# Patient Record
Sex: Female | Born: 1975 | Race: Black or African American | Hispanic: No | Marital: Single | Smoking: Current every day smoker
Health system: Southern US, Community
[De-identification: ages and names within clinical notes are randomized; demographics above are authoritative.]

## PROBLEM LIST (undated history)

## (undated) DIAGNOSIS — R51 Headache: Secondary | ICD-10-CM

## (undated) DIAGNOSIS — I10 Essential (primary) hypertension: Secondary | ICD-10-CM

---

## 2011-02-25 ENCOUNTER — Emergency Department (HOSPITAL_COMMUNITY)
Admission: EM | Admit: 2011-02-25 | Discharge: 2011-02-25 | Discharge status: Against Medical Advice | Payer: Managed Care, Other (non HMO) | Attending: Emergency Medicine | Admitting: Emergency Medicine

## 2011-02-25 DIAGNOSIS — N644 Mastodynia: Secondary | ICD-10-CM | POA: Insufficient documentation

## 2011-03-01 ENCOUNTER — Other Ambulatory Visit: Payer: Self-pay | Admitting: Family Medicine

## 2011-03-01 DIAGNOSIS — N6310 Unspecified lump in the right breast, unspecified quadrant: Secondary | ICD-10-CM

## 2011-03-14 ENCOUNTER — Ambulatory Visit
Admission: RE | Admit: 2011-03-14 | Discharge: 2011-03-14 | Disposition: A | Discharge status: Home / Self Care | Payer: Managed Care, Other (non HMO) | Source: Ambulatory Visit | Attending: Family Medicine | Admitting: Family Medicine

## 2011-03-14 DIAGNOSIS — N6310 Unspecified lump in the right breast, unspecified quadrant: Secondary | ICD-10-CM

## 2011-05-26 ENCOUNTER — Emergency Department (HOSPITAL_COMMUNITY)
Admission: EM | Admit: 2011-05-26 | Discharge: 2011-05-26 | Disposition: A | Discharge status: Home / Self Care | Payer: Managed Care, Other (non HMO) | Attending: Emergency Medicine | Admitting: Emergency Medicine

## 2011-05-26 ENCOUNTER — Encounter (HOSPITAL_COMMUNITY): Payer: Self-pay | Admitting: *Deleted

## 2011-05-26 DIAGNOSIS — J029 Acute pharyngitis, unspecified: Secondary | ICD-10-CM

## 2011-05-26 DIAGNOSIS — H6590 Unspecified nonsuppurative otitis media, unspecified ear: Secondary | ICD-10-CM | POA: Insufficient documentation

## 2011-05-26 DIAGNOSIS — R51 Headache: Secondary | ICD-10-CM | POA: Insufficient documentation

## 2011-05-26 DIAGNOSIS — H9209 Otalgia, unspecified ear: Secondary | ICD-10-CM | POA: Insufficient documentation

## 2011-05-26 DIAGNOSIS — R6883 Chills (without fever): Secondary | ICD-10-CM | POA: Insufficient documentation

## 2011-05-26 DIAGNOSIS — I10 Essential (primary) hypertension: Secondary | ICD-10-CM | POA: Insufficient documentation

## 2011-05-26 HISTORY — DX: Headache: R51

## 2011-05-26 HISTORY — DX: Essential (primary) hypertension: I10

## 2011-05-26 MED ORDER — ACETAMINOPHEN 325 MG PO TABS
975.0000 mg | ORAL_TABLET | Freq: Once | ORAL | Status: AC
  Administered 2011-05-26: 975 mg via ORAL

## 2011-05-26 MED ORDER — ACETAMINOPHEN 325 MG PO TABS
ORAL_TABLET | ORAL | Status: AC
  Filled 2011-05-26: qty 3

## 2011-05-26 MED ORDER — LIDOCAINE VISCOUS 2 % MT SOLN
20.0000 mL | Freq: Once | OROMUCOSAL | Status: AC
  Administered 2011-05-26: 20 mL via OROMUCOSAL
  Filled 2011-05-26: qty 15

## 2011-05-26 MED ORDER — OXYMETAZOLINE HCL 0.05 % NA SOLN
1.0000 | Freq: Once | NASAL | Status: AC
  Administered 2011-05-26: 1 via NASAL
  Filled 2011-05-26: qty 15

## 2011-05-26 NOTE — ED Notes (Signed)
Dr. Lockwood into room 

## 2011-05-26 NOTE — ED Notes (Signed)
C/o sore throat, R earache & HA. "Unsure of fever", states, "i feel hot". Onset 1-2d ago. (denies: nvd). Mentions has had HAs recently, no dx'd h/o migraines, has a h/o htn. Pt alert, interactive, calm, skin W&D, resps e/u, NAD, speaking in clear complete sentences, speech unalterred.

## 2011-05-26 NOTE — ED Provider Notes (Signed)
History     CSN: [id number]  Arrival date & time 05/26/11  4098   First MD Initiated Contact with Patient 05/26/11 [phone]      Chief Complaint  Patient presents with  . Sore Throat  . Headache    (Consider location/radiation/quality/duration/timing/severity/associated sxs/prior treatment) HPI The patient notes that her symptoms began gradually 3 days ago, since onset there has been persistent discomfort in her throat, head and right ear. The throat pain is worse with swallowing, speaking. The headache is diffuse, throbbing. The earache is described as sore, without hearing loss.  The patient notes that she does not like to take medications, and there have been no other notable alleviating factors. No clear exacerbating factors either. She also complains of subjective fevers, chills, without objective fever. No nausea, no vomiting, no diarrhea, no dysuria, no disorientation. Past Medical History  Diagnosis Date  . Hypertension   . Headache     History reviewed. No pertinent past surgical history.  History reviewed. No pertinent family history.  History  Substance Use Topics  . Smoking status: Never Smoker   . Smokeless tobacco: Not on file  . Alcohol Use: No    OB History    Grav Para Term Preterm Abortions TAB SAB Ect Mult Living                  Review of Systems  Constitutional:       HPI  HENT:       HPI otherwise negative  Eyes: Negative.   Respiratory:       HPI, otherwise negative  Cardiovascular:       HPI, otherwise nmegative  Gastrointestinal: Negative for vomiting.  Genitourinary:       HPI, otherwise negative  Musculoskeletal:       HPI, otherwise negative  Skin: Negative.   Neurological: Negative for syncope.    Allergies  Review of patient's allergies indicates not on file.  Home Medications   Current Outpatient Rx  Name Route Sig Dispense Refill  . AMLODIPINE BESYLATE 5 MG PO TABS Oral Take 5 mg by mouth daily.    . IBUPROFEN 200 MG PO  CAPS Oral Take 2 capsules by mouth once as needed. For migraines    . METOPROLOL SUCCINATE ER 50 MG PO TB24 Oral Take 50 mg by mouth daily. Take with or immediately following a meal.      BP 159/108  Pulse 98  Temp(Src) 98.7 F (37.1 C) (Oral)  Resp 20  SpO2 100%  Physical Exam  Nursing note and vitals reviewed. Constitutional: She is oriented to person, place, and time. She appears well-developed and well-nourished. No distress.  HENT:  Head: Atraumatic. Macrocephalic.  Right Ear: Tympanic membrane is not erythematous. A middle ear effusion is present.  Mouth/Throat: Uvula is midline, oropharynx is clear and moist and mucous membranes are normal. No oropharyngeal exudate.  Eyes: Conjunctivae and EOM are normal.  Cardiovascular: Normal rate and regular rhythm.   Pulmonary/Chest: Effort normal and breath sounds normal. No stridor. No respiratory distress.  Abdominal: She exhibits no distension.  Musculoskeletal: She exhibits no edema.  Neurological: She is alert and oriented to person, place, and time. No cranial nerve deficit.  Skin: Skin is warm and dry.  Psychiatric: She has a normal mood and affect.    ED Course  Procedures (including critical care time)  Labs Reviewed - No data to display No results found.   No diagnosis found.    MDM  This generally  well 36 year old female now presents with 3 days of sore throat, headache, ear pain. On exam the patient is in no distress. The patient's vital signs are notable for mild hypertension. The patient's right tympanic membrane has mild effusion, without overt signs of infection. Given the patient's description of complaints, her presentation is most consistent with pharyngitis, plus/minus sinusitis.  The patient is stable for outpatient continued evaluation, following the ED provision of analgesia and Sudafed.        Gerhard Munch, MD 05/26/11 [phone]

## 2011-05-27 ENCOUNTER — Emergency Department (HOSPITAL_COMMUNITY)
Admission: EM | Admit: 2011-05-27 | Discharge: 2011-05-27 | Disposition: A | Discharge status: Home / Self Care | Payer: Managed Care, Other (non HMO) | Attending: Emergency Medicine | Admitting: Emergency Medicine

## 2011-05-27 DIAGNOSIS — J029 Acute pharyngitis, unspecified: Secondary | ICD-10-CM | POA: Insufficient documentation

## 2011-05-27 DIAGNOSIS — R51 Headache: Secondary | ICD-10-CM | POA: Insufficient documentation

## 2011-05-27 DIAGNOSIS — J351 Hypertrophy of tonsils: Secondary | ICD-10-CM | POA: Insufficient documentation

## 2011-05-27 DIAGNOSIS — B9789 Other viral agents as the cause of diseases classified elsewhere: Secondary | ICD-10-CM | POA: Insufficient documentation

## 2011-05-27 DIAGNOSIS — H9209 Otalgia, unspecified ear: Secondary | ICD-10-CM | POA: Insufficient documentation

## 2011-05-27 DIAGNOSIS — Z79899 Other long term (current) drug therapy: Secondary | ICD-10-CM | POA: Insufficient documentation

## 2011-05-27 DIAGNOSIS — I10 Essential (primary) hypertension: Secondary | ICD-10-CM | POA: Insufficient documentation

## 2011-05-27 DIAGNOSIS — IMO0001 Reserved for inherently not codable concepts without codable children: Secondary | ICD-10-CM | POA: Insufficient documentation

## 2011-05-27 MED ORDER — ACETAMINOPHEN-CODEINE 120-12 MG/5ML PO SUSP: 5.0000 mL | Freq: Four times a day (QID) | ORAL | Status: AC | PRN

## 2011-05-27 NOTE — ED Notes (Signed)
MD at bedside. 

## 2011-05-27 NOTE — ED Provider Notes (Signed)
Medical screening examination/treatment/procedure(s) were performed by non-physician practitioner and as supervising physician I was immediately available for consultation/collaboration.   Forbes Cellar, MD 05/27/11 1141

## 2011-05-27 NOTE — ED Provider Notes (Signed)
History     CSN: [id number]  Arrival date & time 05/27/11  1038   First MD Initiated Contact with Patient 05/27/11 1116      Chief Complaint  Patient presents with  . Sore Throat  . Headache    (Consider location/radiation/quality/duration/timing/severity/associated sxs/prior treatment) HPI  36 year old female presenting to the ED complaining of sore throat headache. Patient has been having headache, sore throat, ear pain, and body aches for the past 4 days. She was seen in the ER yesterday for complaint. She was diagnosed with viral pharyngitis, questionable sinusitis. Patient was given medication nasal spray and gargle solution while in ED. Patient states she has been taking Advil at home with no improvement.  Pt denies fever, voice changes, cp, sob, or rash.  She denies vomiting, diiarrhea, or abd pain.    Past Medical History  Diagnosis Date  . Hypertension   . Headache     No past surgical history on file.  No family history on file.  History  Substance Use Topics  . Smoking status: Never Smoker   . Smokeless tobacco: Not on file  . Alcohol Use: No    OB History    Grav Para Term Preterm Abortions TAB SAB Ect Mult Living                  Review of Systems  All other systems reviewed and are negative.    Allergies  Review of patient's allergies indicates no known allergies.  Home Medications   Current Outpatient Rx  Name Route Sig Dispense Refill  . AMLODIPINE BESYLATE 5 MG PO TABS Oral Take 5 mg by mouth daily.    . IBUPROFEN 200 MG PO CAPS Oral Take 2 capsules by mouth once as needed. For migraines    . METOPROLOL SUCCINATE ER 50 MG PO TB24 Oral Take 50 mg by mouth daily. Take with or immediately following a meal.      BP 150/99  Pulse 99  Temp(Src) 98.6 F (37 C) (Oral)  Resp 16  SpO2 100%  Physical Exam  Nursing note and vitals reviewed. Constitutional: She appears well-developed. No distress.       Awake, alert, nontoxic appearance    HENT:  Head: Normocephalic and atraumatic.  Right Ear: External ear normal.  Left Ear: External ear normal.  Nose: Nose normal.  Mouth/Throat: Uvula is midline and oropharynx is clear and moist.         No voice changes, speak in complete sentence  Eyes: Right eye exhibits no discharge. Left eye exhibits no discharge.  Neck: Neck supple.  Pulmonary/Chest: Effort normal. She exhibits no tenderness.  Abdominal: There is no tenderness. There is no rebound.  Musculoskeletal: She exhibits no tenderness.       Baseline ROM, no obvious new focal weakness  Neurological:       Mental status and motor strength appears baseline for patient and situation  Skin: No rash noted.  Psychiatric: She has a normal mood and affect.    ED Course  Procedures (including critical care time)  Labs Reviewed - No data to display No results found.   No diagnosis found.    MDM  Patient is afebrile. She is able to speak in complete sentences. Of throat evaluation reveals tonsillar enlargement bilaterally without evidence of peri-tonsillar abscess. This is likely a viral etiology. Reassurance given. I will prescribe throat lorengzes and pain medication for the symptoms. Pt voice understanding.  Referral given.  Fayrene Helper, PA-C 05/27/11 1134

## 2011-05-27 NOTE — ED Notes (Signed)
Here yesterday for sore throat and h/a. Given nasal spray, and goggled gel, and then sent home. Been worse since yesterday. Taking advil.

## 2011-11-30 ENCOUNTER — Other Ambulatory Visit (HOSPITAL_COMMUNITY)
Admission: RE | Admit: 2011-11-30 | Discharge: 2011-11-30 | Disposition: A | Discharge status: Home / Self Care | Payer: Managed Care, Other (non HMO) | Source: Ambulatory Visit | Attending: Family Medicine | Admitting: Family Medicine

## 2011-11-30 DIAGNOSIS — Z113 Encounter for screening for infections with a predominantly sexual mode of transmission: Secondary | ICD-10-CM | POA: Insufficient documentation

## 2011-11-30 DIAGNOSIS — Z01419 Encounter for gynecological examination (general) (routine) without abnormal findings: Secondary | ICD-10-CM | POA: Insufficient documentation

## 2012-12-26 ENCOUNTER — Emergency Department (HOSPITAL_COMMUNITY)
Admission: EM | Admit: 2012-12-26 | Discharge: 2012-12-26 | Disposition: A | Discharge status: Home / Self Care | Payer: BC Managed Care – PPO | Attending: Emergency Medicine | Admitting: Emergency Medicine

## 2012-12-26 ENCOUNTER — Encounter (HOSPITAL_COMMUNITY): Payer: Self-pay | Admitting: Emergency Medicine

## 2012-12-26 DIAGNOSIS — R51 Headache: Secondary | ICD-10-CM | POA: Insufficient documentation

## 2012-12-26 DIAGNOSIS — Z79899 Other long term (current) drug therapy: Secondary | ICD-10-CM | POA: Insufficient documentation

## 2012-12-26 DIAGNOSIS — R112 Nausea with vomiting, unspecified: Secondary | ICD-10-CM | POA: Insufficient documentation

## 2012-12-26 DIAGNOSIS — F17200 Nicotine dependence, unspecified, uncomplicated: Secondary | ICD-10-CM | POA: Insufficient documentation

## 2012-12-26 DIAGNOSIS — R519 Headache, unspecified: Secondary | ICD-10-CM

## 2012-12-26 MED ORDER — SODIUM CHLORIDE 0.9 % IV BOLUS (SEPSIS)
1000.0000 mL | Freq: Once | INTRAVENOUS | Status: AC
  Administered 2012-12-26: 1000 mL via INTRAVENOUS

## 2012-12-26 MED ORDER — KETOROLAC TROMETHAMINE 30 MG/ML IJ SOLN
15.0000 mg | Freq: Once | INTRAMUSCULAR | Status: AC
  Administered 2012-12-26: 15 mg via INTRAVENOUS
  Filled 2012-12-26: qty 1

## 2012-12-26 MED ORDER — DIPHENHYDRAMINE HCL 50 MG/ML IJ SOLN
25.0000 mg | Freq: Once | INTRAMUSCULAR | Status: AC
  Administered 2012-12-26: 25 mg via INTRAVENOUS
  Filled 2012-12-26: qty 1

## 2012-12-26 MED ORDER — METOCLOPRAMIDE HCL 5 MG/ML IJ SOLN
10.0000 mg | Freq: Once | INTRAMUSCULAR | Status: AC
  Administered 2012-12-26: 10 mg via INTRAVENOUS
  Filled 2012-12-26: qty 2

## 2012-12-26 NOTE — ED Notes (Signed)
Woke up w/ h/a this am and it made her vomit and her bp is high

## 2013-01-05 NOTE — ED Provider Notes (Signed)
CSN: [id number]     Arrival date & time 12/26/12  1036 History     First MD Initiated Contact with Patient 12/26/12 2270     37 year old female with headache. Patient noticed when she woke up this morning. Constant throughout the day. Headache is in the frontal region and throbbing in nature. No appreciable exacerbating relieving factors. Associated with nausea and vomiting. No photophobia or other visual changes. No neck pain or neck stiffness. Denies any recent trauma. No use of blood thinning medication.No fever or chills. No acute numbness, tingling or loss strength. No intervention prior to arrival.  Chief Complaint  Patient presents with  . Hypertension   (Consider location/radiation/quality/duration/timing/severity/associated sxs/prior Treatment) HPI  Past Medical History  Diagnosis Date  . Hypertension   . Headache(784.0)    No past surgical history on file. No family history on file. History  Substance Use Topics  . Smoking status: Current Every Day Smoker  . Smokeless tobacco: Not on file  . Alcohol Use: No   OB History   Grav Para Term Preterm Abortions TAB SAB Ect Mult Living                 Review of Systems  All systems reviewed and negative, other than as noted in HPI.   Allergies  Review of patient's allergies indicates no known allergies.  Home Medications   Current Outpatient Rx  Name  Route  Sig  Dispense  Refill  . amLODipine (NORVASC) 5 MG tablet   Oral   Take 5 mg by mouth daily.         . Ibuprofen (ADVIL MIGRAINE) 200 MG CAPS   Oral   Take 2 capsules by mouth once as needed. For migraines         . metoprolol succinate (TOPROL-XL) 50 MG 24 hr tablet   Oral   Take 50 mg by mouth daily. Take with or immediately following a meal.          BP 133/85  Pulse 91  Temp(Src) 98.6 F (37 C)  Resp 16  SpO2 100% Physical Exam  Nursing note and vitals reviewed. Constitutional: She is oriented to person, place, and time. She appears  well-developed and well-nourished. No distress.  HENT:  Head: Normocephalic and atraumatic.  Eyes: Conjunctivae are normal. Pupils are equal, round, and reactive to light. Right eye exhibits no discharge. Left eye exhibits no discharge.  Neck: Neck supple.  No nuchal rigidity  Cardiovascular: Normal rate, regular rhythm and normal heart sounds.  Exam reveals no gallop and no friction rub.   No murmur heard. Pulmonary/Chest: Effort normal and breath sounds normal. No respiratory distress.  Abdominal: Soft. She exhibits no distension. There is no tenderness.  Musculoskeletal: She exhibits no edema and no tenderness.  Neurological: She is alert and oriented to person, place, and time. No cranial nerve deficit. She exhibits normal muscle tone. Coordination normal.  Skin: Skin is warm and dry.  Psychiatric: She has a normal mood and affect. Her behavior is normal. Thought content normal.    ED Course   Procedures (including critical care time)  Labs Reviewed - No data to display No results found. 1. Headache     MDM  37 year old female with headache. Suspect primary HA. Consider emergent secondary causes such as bleed, infectious or mass but doubt. There is no history of trauma. Pt has a nonfocal neurological exam. Afebrile and neck supple. No use of blood thinning medication. Consider ocular etiology such as acute  angle closure glaucoma but doubt. Pt denies acute change in visual acuity and eye exam unremarkable. Doubt temporal arteritis given age, no temporal tenderness and temporal artery pulsations palpable. Doubt CO poisoning. No contacts with similar symptoms. Doubt venous thrombosis. Doubt carotid or vertebral arteries dissection. Symptoms improved with meds. Patient's blood pressure improved with treatment of her headache. Feel that can be safely discharged, but strict return precautions discussed. Outpt fu.   Raeford Razor, MD 01/05/13 [phone]

## 2013-01-23 ENCOUNTER — Other Ambulatory Visit: Payer: Self-pay | Admitting: Nurse Practitioner

## 2013-01-23 ENCOUNTER — Other Ambulatory Visit (HOSPITAL_COMMUNITY)
Admission: RE | Admit: 2013-01-23 | Discharge: 2013-01-23 | Disposition: A | Discharge status: Home / Self Care | Payer: BC Managed Care – PPO | Source: Ambulatory Visit | Attending: Obstetrics and Gynecology | Admitting: Obstetrics and Gynecology

## 2013-01-23 DIAGNOSIS — Z1151 Encounter for screening for human papillomavirus (HPV): Secondary | ICD-10-CM | POA: Insufficient documentation

## 2013-01-23 DIAGNOSIS — Z01419 Encounter for gynecological examination (general) (routine) without abnormal findings: Secondary | ICD-10-CM | POA: Insufficient documentation

## 2013-05-10 ENCOUNTER — Encounter (HOSPITAL_COMMUNITY): Payer: Self-pay | Admitting: Emergency Medicine

## 2013-05-10 ENCOUNTER — Emergency Department (INDEPENDENT_AMBULATORY_CARE_PROVIDER_SITE_OTHER)
Admission: EM | Admit: 2013-05-10 | Discharge: 2013-05-10 | Disposition: A | Discharge status: Home / Self Care | Payer: Self-pay | Source: Home / Self Care | Attending: Family Medicine | Admitting: Family Medicine

## 2013-05-10 DIAGNOSIS — J069 Acute upper respiratory infection, unspecified: Secondary | ICD-10-CM

## 2013-05-10 DIAGNOSIS — I10 Essential (primary) hypertension: Secondary | ICD-10-CM

## 2013-05-10 NOTE — ED Provider Notes (Signed)
Elizabeth Rogers is a 37 y.o. female who presents to Urgent Care today for one day of cough muscle aches fatigue and chills. One episode of vomiting as well. No shortness of breath or diarrhea. No significant abdominal pain. Patient has tried some TheraFlu which made her feel sick to her stomach. Her son was recently ill with a similar illness. She feels well otherwise   Past Medical History  Diagnosis Date  . Hypertension   . RUEAVWUJ(811.9)    History  Substance Use Topics  . Smoking status: Current Every Day Smoker  . Smokeless tobacco: Not on file  . Alcohol Use: No   ROS as above Medications reviewed. No current facility-administered medications for this encounter.   Current Outpatient Prescriptions  Medication Sig Dispense Refill  . amLODipine (NORVASC) 5 MG tablet Take 5 mg by mouth daily.      . Ibuprofen (ADVIL MIGRAINE) 200 MG CAPS Take 2 capsules by mouth once as needed. For migraines      . metoprolol succinate (TOPROL-XL) 50 MG 24 hr tablet Take 50 mg by mouth daily. Take with or immediately following a meal.        Exam:  BP 157/122  Pulse 94  Temp(Src) 98.3 F (36.8 C) (Oral)  Resp 18  SpO2 99% Gen: Well NAD HEENT: EOMI,  MMM, difficult to see posterior pharynx but not very erythematous. Tympanic membranes are normal appearing bilaterally Lungs: Normal work of breathing. CTABL Heart: RRR no MRG Abd: NABS, Soft. NT, ND Exts: Non edematous BL  LE, warm and well perfused.   No results found for this or any previous visit (from the past 24 hour(s)). No results found.  Assessment and Plan: 37 y.o. female with  1) viral URI. Plan for symptomatic management with Tylenol. Additionally recommend Mucinex as needed. Followup if not improving. 2) hypertension: Elevated today. Not sure if this is a medication effect or the patient's normal blood pressure. I recommended she followup with her primary care provider in a few weeks for blood pressure management.  Discussed  warning signs or symptoms. Please see discharge instructions. Patient expresses understanding.      Rodolph Bong, MD 05/10/13 [phone]
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
## 2014-06-07 ENCOUNTER — Encounter (HOSPITAL_COMMUNITY): Payer: Self-pay | Admitting: Emergency Medicine

## 2014-06-07 ENCOUNTER — Emergency Department (HOSPITAL_COMMUNITY)
Admission: EM | Admit: 2014-06-07 | Discharge: 2014-06-07 | Disposition: A | Discharge status: Home / Self Care | Payer: No Typology Code available for payment source | Attending: Emergency Medicine | Admitting: Emergency Medicine

## 2014-06-07 DIAGNOSIS — R55 Syncope and collapse: Secondary | ICD-10-CM | POA: Insufficient documentation

## 2014-06-07 DIAGNOSIS — I10 Essential (primary) hypertension: Secondary | ICD-10-CM | POA: Insufficient documentation

## 2014-06-07 DIAGNOSIS — Z79899 Other long term (current) drug therapy: Secondary | ICD-10-CM | POA: Insufficient documentation

## 2014-06-07 DIAGNOSIS — Z720 Tobacco use: Secondary | ICD-10-CM | POA: Insufficient documentation

## 2014-06-07 LAB — URINALYSIS, ROUTINE W REFLEX MICROSCOPIC
BILIRUBIN URINE: NEGATIVE
GLUCOSE, UA: NEGATIVE mg/dL
Ketones, ur: NEGATIVE mg/dL
NITRITE: NEGATIVE
PH: 8 (ref 5.0–8.0)
PROTEIN: 100 mg/dL — AB
SPECIFIC GRAVITY, URINE: 1.015 (ref 1.005–1.030)
Urobilinogen, UA: 1 mg/dL (ref 0.0–1.0)

## 2014-06-07 LAB — URINE MICROSCOPIC-ADD ON

## 2014-06-07 LAB — I-STAT CHEM 8, ED
BUN: 4 mg/dL — AB (ref 6–23)
CREATININE: 0.8 mg/dL (ref 0.50–1.10)
Calcium, Ion: 1.11 mmol/L — ABNORMAL LOW (ref 1.12–1.23)
Chloride: 104 mmol/L (ref 96–112)
Glucose, Bld: 111 mg/dL — ABNORMAL HIGH (ref 70–99)
HCT: 46 % (ref 36.0–46.0)
HEMOGLOBIN: 15.6 g/dL — AB (ref 12.0–15.0)
POTASSIUM: 3.3 mmol/L — AB (ref 3.5–5.1)
Sodium: 140 mmol/L (ref 135–145)
TCO2: 20 mmol/L (ref 0–100)

## 2014-06-07 LAB — CBC
HEMATOCRIT: 42.9 % (ref 36.0–46.0)
Hemoglobin: 13.8 g/dL (ref 12.0–15.0)
MCH: 26.6 pg (ref 26.0–34.0)
MCHC: 32.2 g/dL (ref 30.0–36.0)
MCV: 82.8 fL (ref 78.0–100.0)
Platelets: 212 10*3/uL (ref 150–400)
RBC: 5.18 MIL/uL — AB (ref 3.87–5.11)
RDW: 13.5 % (ref 11.5–15.5)
WBC: 5.4 10*3/uL (ref 4.0–10.5)

## 2014-06-07 LAB — PREGNANCY, URINE: Preg Test, Ur: NEGATIVE

## 2014-06-07 MED ORDER — DIPHENHYDRAMINE HCL 50 MG/ML IJ SOLN
25.0000 mg | Freq: Once | INTRAMUSCULAR | Status: AC
  Administered 2014-06-07: 25 mg via INTRAVENOUS
  Filled 2014-06-07: qty 1

## 2014-06-07 MED ORDER — METOCLOPRAMIDE HCL 5 MG/ML IJ SOLN
10.0000 mg | Freq: Once | INTRAMUSCULAR | Status: AC
  Administered 2014-06-07: 10 mg via INTRAVENOUS
  Filled 2014-06-07: qty 2

## 2014-06-07 NOTE — ED Provider Notes (Signed)
CSN: [id number]     Arrival date & time 06/07/14  1535 History   First MD Initiated Contact with Patient 06/07/14 1542     Chief Complaint  Patient presents with  . Hypertension  . Nausea  . Dizziness     (Consider location/radiation/quality/duration/timing/severity/associated sxs/prior Treatment) HPI  Pt presenting with c/o episode of lightheadedness, nausea, frontal headache.  She states symptoms began suddenly while she was at work.  She felt that she would nearly faint.  Her BP was checked and it was elevated, so EMS was called.  She was given zofran en route which temporarily made her feel better.  No chest pain, no shortness of breath.  She was diagnosed with HTN and was prescribed metoprolol xl and amlodipine- states she has not been taking the meds for 2 weeks.  She states she doesn't like taking pills and was not convinced that she needed to take them.  There are no other associated systemic symptoms, there are no other alleviating or modifying factors.   Past Medical History  Diagnosis Date  . Hypertension   . Headache(784.0)    History reviewed. No pertinent past surgical history. No family history on file. History  Substance Use Topics  . Smoking status: Current Every Day Smoker  . Smokeless tobacco: Not on file  . Alcohol Use: No   OB History    No data available     Review of Systems  ROS reviewed and all otherwise negative except for mentioned in HPI    Allergies  Review of patient's allergies indicates no known allergies.  Home Medications   Prior to Admission medications   Medication Sig Start Date End Date Taking? Authorizing Provider  amLODipine (NORVASC) 5 MG tablet Take 5 mg by mouth daily.   Yes Historical Provider, MD  metoprolol succinate (TOPROL-XL) 50 MG 24 hr tablet Take 50 mg by mouth daily. Take with or immediately following a meal.   Yes Historical Provider, MD  vitamin B-12 (CYANOCOBALAMIN) 1000 MCG tablet Take 1,000 mcg by mouth daily.    Yes Historical Provider, MD  Ibuprofen (ADVIL MIGRAINE) 200 MG CAPS Take 2 capsules by mouth once as needed. For migraines    Historical Provider, MD   BP 144/92 mmHg  Pulse 69  Temp(Src) 98.1 F (36.7 C) (Oral)  Resp 17  Ht  (1.803 m)  Wt 256 lb (116.121 kg)  BMI 35.72 kg/m2  SpO2 100%  Vitals reviewed Physical Exam  Physical Examination: General appearance - alert, well appearing, and in no distress Mental status - alert, oriented to person, place, and time Eyes - pupils equal and reactive, extraocular eye movements intact Mouth - mucous membranes moist, pharynx normal without lesions Chest - clear to auscultation, no wheezes, rales or rhonchi, symmetric air entry Heart - normal rate, regular rhythm, normal S1, S2, no murmurs, rubs, clicks or gallops Neurological - alert, oriented  3, no cranial nerve deficit, strength 5/5 in extremities x 4, sensation intact Extremities - peripheral pulses normal, no pedal edema, no clubbing or cyanosis Skin - normal coloration and turgor, no rashes  ED Course  Procedures (including critical care time) Labs Review Labs Reviewed  URINALYSIS, ROUTINE W REFLEX MICROSCOPIC - Abnormal; Notable for the following:    Color, Urine RED (*)    APPearance CLOUDY (*)    Hgb urine dipstick LARGE (*)    Protein, ur 100 (*)    Leukocytes, UA TRACE (*)    All other components within normal limits  CBC - Abnormal; Notable for the following:    RBC 5.18 (*)    All other components within normal limits  I-STAT CHEM 8, ED - Abnormal; Notable for the following:    Potassium 3.3 (*)    BUN 4 (*)    Glucose, Bld 111 (*)    Calcium, Ion 1.11 (*)    Hemoglobin 15.6 (*)    All other components within normal limits  PREGNANCY, URINE  URINE MICROSCOPIC-ADD ON    Imaging Review No results found.   EKG Interpretation   Date/Time:  Monday June 07 2014 16:06:30 EST Ventricular Rate:  87 PR Interval:  166 QRS Duration: 83 QT Interval:  412 QTC  Calculation: 496 R Axis:   37 Text Interpretation:  Sinus rhythm Borderline prolonged QT interval No old  tracing to compare Confirmed by [facility]  MD, Ashleymarie Granderson [phone]([id number] on 06/07/2014  4:11:50 PM      MDM   Final diagnoses:  Near syncope  Essential hypertension    Pt presenting with sympotms of near syncope in the setting of hypertension and not taking her bp meds.  Pt has normal exam in the ED.  Labs are reassuring, no anemia, no renal abnormalities.  She has blood and protein in UA but is currently on menses.  Pt is planning to start taking her BP meds again and will arrange for close followup with PMD.  Discharged with strict return precautions.  Pt agreeable with plan.  Nursing notes including past medical history and social history reviewed and considered in documentation Prior records reviewed and considered during this visit     Ethelda ChickMartha K Linker, MD 06/07/14 1759

## 2014-06-07 NOTE — ED Notes (Signed)
Patient is aware of pending UA

## 2014-06-07 NOTE — ED Notes (Addendum)
Pt is on menstrual cycle 

## 2014-06-07 NOTE — Discharge Instructions (Signed)
Return to the ED with any concerns including chest pain, difficulty breathing, severe headache, changes in vision or speech, weakness in arms or legs, fainting, decreased level of alertness/lethargy, or any other alarming symptoms

## 2014-06-07 NOTE — ED Notes (Signed)
Pt c/o HTN, nausea, and dizziness. Pt stopped taking BP meds 2 weeks ago because " I wanted to be my own doctor" 20 g in left hand 4 mg zofran in route,

## 2015-11-07 ENCOUNTER — Encounter (HOSPITAL_COMMUNITY): Payer: Self-pay

## 2015-11-07 ENCOUNTER — Ambulatory Visit (HOSPITAL_COMMUNITY)
Admission: EM | Admit: 2015-11-07 | Discharge: 2015-11-07 | Disposition: A | Discharge status: Home / Self Care | Payer: Managed Care, Other (non HMO) | Attending: Family Medicine | Admitting: Family Medicine

## 2015-11-07 DIAGNOSIS — M542 Cervicalgia: Secondary | ICD-10-CM

## 2015-11-07 MED ORDER — CYCLOBENZAPRINE HCL 10 MG PO TABS: 10.0000 mg | ORAL_TABLET | Freq: Two times a day (BID) | ORAL | Status: DC | PRN
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
## 2015-11-07 NOTE — Discharge Instructions (Signed)

## 2015-11-07 NOTE — ED Notes (Signed)
Patient discharged by Frank Patrick, PA 
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
## 2016-01-09 ENCOUNTER — Emergency Department (HOSPITAL_COMMUNITY): Payer: Managed Care, Other (non HMO)

## 2016-01-09 ENCOUNTER — Encounter (HOSPITAL_COMMUNITY): Payer: Self-pay

## 2016-01-09 ENCOUNTER — Emergency Department (HOSPITAL_COMMUNITY)
Admission: EM | Admit: 2016-01-09 | Discharge: 2016-01-09 | Discharge status: Against Medical Advice | Payer: Managed Care, Other (non HMO) | Attending: Emergency Medicine | Admitting: Emergency Medicine

## 2016-01-09 DIAGNOSIS — R791 Abnormal coagulation profile: Secondary | ICD-10-CM | POA: Insufficient documentation

## 2016-01-09 DIAGNOSIS — R51 Headache: Secondary | ICD-10-CM | POA: Insufficient documentation

## 2016-01-09 DIAGNOSIS — R519 Headache, unspecified: Secondary | ICD-10-CM

## 2016-01-09 DIAGNOSIS — R200 Anesthesia of skin: Secondary | ICD-10-CM

## 2016-01-09 DIAGNOSIS — I639 Cerebral infarction, unspecified: Secondary | ICD-10-CM

## 2016-01-09 DIAGNOSIS — F17210 Nicotine dependence, cigarettes, uncomplicated: Secondary | ICD-10-CM | POA: Insufficient documentation

## 2016-01-09 DIAGNOSIS — M6289 Other specified disorders of muscle: Secondary | ICD-10-CM | POA: Diagnosis not present

## 2016-01-09 DIAGNOSIS — I10 Essential (primary) hypertension: Secondary | ICD-10-CM | POA: Insufficient documentation

## 2016-01-09 LAB — COMPREHENSIVE METABOLIC PANEL
ALBUMIN: 4 g/dL (ref 3.5–5.0)
ALT: 14 U/L (ref 14–54)
AST: 20 U/L (ref 15–41)
Alkaline Phosphatase: 57 U/L (ref 38–126)
Anion gap: 11 (ref 5–15)
BUN: 12 mg/dL (ref 6–20)
CHLORIDE: 99 mmol/L — AB (ref 101–111)
CO2: 26 mmol/L (ref 22–32)
CREATININE: 1 mg/dL (ref 0.44–1.00)
Calcium: 9.7 mg/dL (ref 8.9–10.3)
GFR calc Af Amer: >60 mL/min (ref >60)
GFR calc non Af Amer: >60 mL/min (ref >60)
GLUCOSE: 96 mg/dL (ref 65–99)
POTASSIUM: 3.1 mmol/L — AB (ref 3.5–5.1)
SODIUM: 136 mmol/L (ref 135–145)
Total Bilirubin: 0.6 mg/dL (ref 0.3–1.2)
Total Protein: 7.5 g/dL (ref 6.5–8.1)

## 2016-01-09 LAB — DIFFERENTIAL
BASOS ABS: 0 10*3/uL (ref 0.0–0.1)
BASOS PCT: 0 %
EOS ABS: 0.1 10*3/uL (ref 0.0–0.7)
Eosinophils Relative: 2 %
Lymphocytes Relative: 46 %
Lymphs Abs: 3.6 10*3/uL (ref 0.7–4.0)
MONOS PCT: 6 %
Monocytes Absolute: 0.4 10*3/uL (ref 0.1–1.0)
NEUTROS ABS: 3.5 10*3/uL (ref 1.7–7.7)
Neutrophils Relative %: 46 %

## 2016-01-09 LAB — I-STAT CHEM 8, ED
BUN: 14 mg/dL (ref 6–20)
CREATININE: 1 mg/dL (ref 0.44–1.00)
Calcium, Ion: 1.17 mmol/L (ref 1.13–1.30)
Chloride: 99 mmol/L — ABNORMAL LOW (ref 101–111)
GLUCOSE: 92 mg/dL (ref 65–99)
HEMATOCRIT: 45 % (ref 36.0–46.0)
HEMOGLOBIN: 15.3 g/dL — AB (ref 12.0–15.0)
POTASSIUM: 3.3 mmol/L — AB (ref 3.5–5.1)
Sodium: 139 mmol/L (ref 135–145)
TCO2: 27 mmol/L (ref 0–100)

## 2016-01-09 LAB — CBC
HEMATOCRIT: 42 % (ref 36.0–46.0)
Hemoglobin: 13.8 g/dL (ref 12.0–15.0)
MCH: 27.4 pg (ref 26.0–34.0)
MCHC: 32.9 g/dL (ref 30.0–36.0)
MCV: 83.3 fL (ref 78.0–100.0)
PLATELETS: 233 10*3/uL (ref 150–400)
RBC: 5.04 MIL/uL (ref 3.87–5.11)
RDW: 13.8 % (ref 11.5–15.5)
WBC: 7.6 10*3/uL (ref 4.0–10.5)

## 2016-01-09 LAB — I-STAT TROPONIN, ED: TROPONIN I, POC: 0 ng/mL (ref 0.00–0.08)

## 2016-01-09 LAB — CBG MONITORING, ED: GLUCOSE-CAPILLARY: 99 mg/dL (ref 65–99)

## 2016-01-09 LAB — PROTIME-INR
INR: 1.04
Prothrombin Time: 13.7 seconds (ref 11.4–15.2)

## 2016-01-09 LAB — APTT: APTT: 31 s (ref 24–36)

## 2016-01-09 MED ORDER — DIPHENHYDRAMINE HCL 50 MG/ML IJ SOLN
25.0000 mg | Freq: Once | INTRAMUSCULAR | Status: AC
  Administered 2016-01-09: 25 mg via INTRAVENOUS
  Filled 2016-01-09: qty 1

## 2016-01-09 MED ORDER — SODIUM CHLORIDE 0.9 % IV BOLUS (SEPSIS)
1000.0000 mL | Freq: Once | INTRAVENOUS | Status: AC
  Administered 2016-01-09: 1000 mL via INTRAVENOUS

## 2016-01-09 MED ORDER — METHYLPREDNISOLONE SODIUM SUCC 125 MG IJ SOLR
125.0000 mg | Freq: Once | INTRAMUSCULAR | Status: AC
  Administered 2016-01-09: 125 mg via INTRAVENOUS
  Filled 2016-01-09: qty 2

## 2016-01-09 MED ORDER — LORAZEPAM 2 MG/ML IJ SOLN
1.0000 mg | Freq: Once | INTRAMUSCULAR | Status: DC
  Filled 2016-01-09: qty 1

## 2016-01-09 MED ORDER — METOCLOPRAMIDE HCL 5 MG/ML IJ SOLN
10.0000 mg | Freq: Once | INTRAMUSCULAR | Status: AC
Start: 2016-01-09 — End: 2016-01-09
  Administered 2016-01-09: 10 mg via INTRAVENOUS
  Filled 2016-01-09: qty 2

## 2016-01-09 NOTE — ED Notes (Signed)
Patient elected to not have MRI done here.  Patient brought back to ED.  Ativan already drawn up to give.  This RN wasted ativan with Barrister's clerkCallie RN and disposed of in sharps.

## 2016-01-09 NOTE — Consult Note (Signed)
Neurology Consultation Reason for Consult: Right-sided weakness Referring Physician: Erin HearingMessner  CC: Right-sided weakness  History is obtained from: Patient  HPI: Elizabeth Rogers is a 40 y.o. female with a history of hypertension who presents with right-sided pain and weakness that started around 4:30 PM. She states that it started gradually beginning around her shoulder and then spreading down her right side. She also complains of headache on the right side. She describes weakness, but the formal confrontational testing is not clear that she is weak versus limited by pain.  She also endorses severe life stressors recently. She does have a history of migraines with occasional aura, but she has never had paresthesias with migraines before.   LKW: 4:30 PM tpa given?: no, mild symptoms    ROS: A 14 point ROS was performed and is negative except as noted in the HPI.   Past Medical History:  Diagnosis Date  . Headache(784.0)   . Hypertension      Family history: No history of similar   Social History:  reports that she has been smoking Cigarettes.  She has been smoking about 0.50 packs per day. She has never used smokeless tobacco. She reports that she drinks alcohol. She reports that she does not use drugs.   Exam: Current vital signs: BP (!) 165/110   Pulse 77   Temp 98.5 F (36.9 C) (Oral)   Resp 15   SpO2 100%  Vital signs in last 24 hours: Temp:  [98.5 F (36.9 C)] 98.5 F (36.9 C) (08/28 1922) Pulse Rate:  [77-88] 77 (08/28 2000) Resp:  [11-17] 15 (08/28 2000) BP: (165-169)/(110-128) 165/110 (08/28 2000) SpO2:  [99 %-100 %] 100 % (08/28 2000)   Physical Exam  Constitutional: Appears well-developed and well-nourished.  Psych: Affect appropriate to situation Eyes: No scleral injection HENT: No OP obstrucion Head: Normocephalic.  Cardiovascular: Normal rate and regular rhythm.  Respiratory: Effort normal and breath sounds normal to anterior ascultation GI: Soft.   No distension. There is no tenderness.  Skin: WDI  Neuro: Mental Status: Patient is awake, alert, oriented to person, place, month, year, and situation. Patient is able to give a clear and coherent history. No signs of aphasia or neglect Cranial Nerves: II: Visual Fields are full. Pupils are equal, round, and reactive to light.   III,IV, VI: EOMI without ptosis or diploplia.  V: Facial sensation is decreased throughout the right side, she does split the midline to pinprick on the for head VII: Facial movement is symmetric.  VIII: hearing is intact to voice X: Uvula elevates symmetrically XI: Shoulder shrug is symmetric. XII: tongue is midline without atrophy or fasciculations.  Motor: Tone is normal. Bulk is normal. Though she does not give full effort to confrontation, she is able to maintain both arm and leg aloft without drift. She has at least 4+/5 strength in both the arm and leg. Sensory: Sensation is decreased in the right arm and leg Plantars: Toes are downgoing bilaterally.  Cerebellar: FNF and HKS are intact bilaterally      I have reviewed labs in epic and the results pertinent to this consultation are: Mildly low potassium  I have reviewed the images obtained:No acute findings on the head CT.   Impression: 40 year old female with right-sided pain and weakness as well as neck pain and headache. My suspicion is that this is either complicated migraine or Somatization. These would be diagnoses of exclusion, however, and I would favor imaging to rule out cervical spine or brain pathology.  Recommendations: 1) MRI brain and C-spine 2) If negative, would consider migraine as etiology   Ritta Slot, MD [facility] [phone]  If 7pm- 7am, please page neurology on call as listed in [facility].

## 2016-01-09 NOTE — ED Notes (Signed)
Patient Alert and oriented X4. Stable and ambulatory. Patient verbalized understanding of the discharge instructions.  Patient belongings were taken by the patient. Patient understood risks of leaving without having MRI being done.

## 2016-01-09 NOTE — ED Notes (Signed)
Patient walked to bathroom.

## 2016-01-09 NOTE — ED Notes (Signed)
Patient taken to MRI

## 2016-01-09 NOTE — ED Triage Notes (Signed)
Pt complaining R side numbness and tingling since 16:30 today.

## 2016-01-12 NOTE — ED Provider Notes (Signed)
[facility]-EMERGENCY DEPT Provider Note   CSN: [id number] Arrival date & time: 01/09/16  1858     History   Chief Complaint Chief Complaint  Patient presents with  . Code Stroke    HPI Elizabeth Rogers is a 40 y.o. female.  Acute onset of right sided numbness and tingling approximately 2.5 hours PTA, no other weakness. No associated symptoms. No modifying factors. Has h/o headache similar to this but not with tingling.       Past Medical History:  Diagnosis Date  . Headache(784.0)   . Hypertension     There are no active problems to display for this patient.   History reviewed. No pertinent surgical history.  OB History    No data available       Home Medications    Prior to Admission medications   Medication Sig Start Date End Date Taking? Authorizing Provider  amLODipine (NORVASC) 5 MG tablet Take 5 mg by mouth daily.   Yes Historical Provider, MD  hydrochlorothiazide (HYDRODIURIL) 12.5 MG tablet Take 12.5 mg by mouth every morning.   Yes Historical Provider, MD  metoprolol tartrate (LOPRESSOR) 25 MG tablet Take 25 mg by mouth every morning.   Yes Historical Provider, MD  [facility] Johns Wort 150 MG CAPS Take 150 mg by mouth every morning.   Yes Historical Provider, MD    Family History History reviewed. No pertinent family history.  Social History Social History  Substance Use Topics  . Smoking status: Current Every Day Smoker    Packs/day: 0.50    Types: Cigarettes  . Smokeless tobacco: Never Used  . Alcohol use Yes     Comment: occ     Allergies   Review of patient's allergies indicates no known allergies.   Review of Systems Review of Systems  All other systems reviewed and are negative.    Physical Exam Updated Vital Signs BP (!) 162/102   Pulse 70   Temp 98.5 F (36.9 C)   Resp 16   SpO2 100%   Physical Exam  Constitutional: She is oriented to person, place, and time. She appears well-developed and well-nourished. No distress.  HENT:    Head: Normocephalic and atraumatic.  Eyes: Conjunctivae are normal.  Neck: Neck supple.  Cardiovascular: Normal rate and regular rhythm.   No murmur heard. Pulmonary/Chest: Effort normal and breath sounds normal. No respiratory distress.  Abdominal: Soft. There is no tenderness.  Musculoskeletal: She exhibits no edema.  Neurological: She is alert and oriented to person, place, and time. A cranial nerve deficit (decreased sensation right face) is present.  Skin: Skin is warm and dry.  Psychiatric: She has a normal mood and affect.  Nursing note and vitals reviewed.    ED Treatments / Results  Labs (all labs ordered are listed, but only abnormal results are displayed) Labs Reviewed  COMPREHENSIVE METABOLIC PANEL - Abnormal; Notable for the following:       Result Value   Potassium 3.1 (*)    Chloride 99 (*)    All other components within normal limits  I-STAT CHEM 8, ED - Abnormal; Notable for the following:    Potassium 3.3 (*)    Chloride 99 (*)    Hemoglobin 15.3 (*)    All other components within normal limits  PROTIME-INR  APTT  CBC  DIFFERENTIAL  Rosezena SensorI-STAT TROPOININ, ED  CBG MONITORING, ED    EKG  EKG Interpretation  Date/Time:  Monday January 09 2016 19:15:15 EDT Ventricular Rate:  91 PR Interval:  186 QRS Duration: 74 QT Interval:  372 QTC Calculation: 457 R Axis:   75 Text Interpretation:  Normal sinus rhythm Nonspecific T wave abnormality Abnormal ECG No significant change since last tracing Confirmed by BEATON  MD, ROBERT ([id number]) on 01/10/2016 9:55:57 AM       Radiology No results found.  Procedures Procedures (including critical care time)  CRITICAL CARE Performed by: Marily Memos Total critical care time: 35 minutes Critical care time was exclusive of separately billable procedures and treating other patients. Critical care was necessary to treat or prevent imminent or life-threatening deterioration. Critical care was time spent personally by me  on the following activities: development of treatment plan with patient and/or surrogate as well as nursing, discussions with consultants, evaluation of patient's response to treatment, examination of patient, obtaining history from patient or surrogate, ordering and performing treatments and interventions, ordering and review of laboratory studies, ordering and review of radiographic studies, pulse oximetry and re-evaluation of patient's condition.   Medications Ordered in ED Medications  metoCLOPramide (REGLAN) injection 10 mg (10 mg Intravenous Given 01/09/16 2020)  sodium chloride 0.9 % bolus 1,000 mL (0 mLs Intravenous Stopped 01/09/16 2234)  diphenhydrAMINE (BENADRYL) injection 25 mg (25 mg Intravenous Given 01/09/16 2020)  methylPREDNISolone sodium succinate (SOLU-MEDROL) 125 mg/2 mL injection 125 mg (125 mg Intravenous Given 01/09/16 2020)     Initial Impression / Assessment and Plan / ED Course  I have reviewed the triage vital signs and the nursing notes.  Pertinent labs & imaging results that were available during my care of the patient were reviewed by me and considered in my medical decision making (see chart for details).  Clinical Course    Code stroke 2/2 new onset facial numbness with headache. Considered tPA but not given secondary to low NIHSS. Symptoms improved in ED while waiting for MRI after headache cocktail, slightly persistent headache however.  Refused MRI. Discussed the risks/benefits of the test and patient understood and wishes to do it as an outpatient and will fu w/ pcp to get it scheduled. Neurologic exam appropriate. No focal deficits. Competent to make decisions and understood risks of doing so. Discharged AMA.   Final Clinical Impressions(s) / ED Diagnoses   Final diagnoses:  Numbness  Nonintractable headache, unspecified chronicity pattern, unspecified headache type    New Prescriptions Discharge Medication List as of 01/09/2016 10:24 PM       Marily Memos, MD 01/12/16 1559

## 2016-03-20 ENCOUNTER — Other Ambulatory Visit: Payer: Self-pay | Admitting: Nurse Practitioner

## 2016-03-20 ENCOUNTER — Other Ambulatory Visit (HOSPITAL_COMMUNITY)
Admission: RE | Admit: 2016-03-20 | Discharge: 2016-03-20 | Disposition: A | Discharge status: Home / Self Care | Payer: Managed Care, Other (non HMO) | Source: Ambulatory Visit | Attending: Nurse Practitioner | Admitting: Nurse Practitioner

## 2016-03-20 DIAGNOSIS — Z1151 Encounter for screening for human papillomavirus (HPV): Secondary | ICD-10-CM | POA: Diagnosis not present

## 2016-03-20 DIAGNOSIS — Z01419 Encounter for gynecological examination (general) (routine) without abnormal findings: Secondary | ICD-10-CM | POA: Diagnosis present

## 2016-03-21 LAB — CYTOLOGY - PAP
Diagnosis: NEGATIVE
HPV: NOT DETECTED

## 2016-06-25 ENCOUNTER — Encounter (HOSPITAL_COMMUNITY): Payer: Self-pay

## 2016-06-25 DIAGNOSIS — Z5321 Procedure and treatment not carried out due to patient leaving prior to being seen by health care provider: Secondary | ICD-10-CM | POA: Insufficient documentation

## 2016-06-25 DIAGNOSIS — F17210 Nicotine dependence, cigarettes, uncomplicated: Secondary | ICD-10-CM

## 2016-06-25 DIAGNOSIS — R51 Headache: Secondary | ICD-10-CM

## 2016-06-25 DIAGNOSIS — I10 Essential (primary) hypertension: Secondary | ICD-10-CM | POA: Diagnosis not present

## 2016-06-25 DIAGNOSIS — Z79899 Other long term (current) drug therapy: Secondary | ICD-10-CM | POA: Diagnosis not present

## 2016-06-25 NOTE — ED Triage Notes (Signed)
Pt states that has had a headache since Friday along with hypertension, denies n/v/photosensitivity. Pt states BP at home 190/140

## 2016-06-26 ENCOUNTER — Encounter (HOSPITAL_COMMUNITY): Payer: Self-pay | Admitting: Emergency Medicine

## 2016-06-26 ENCOUNTER — Emergency Department (HOSPITAL_COMMUNITY)
Admission: EM | Admit: 2016-06-26 | Discharge: 2016-06-26 | Disposition: A | Discharge status: Home / Self Care | Payer: Managed Care, Other (non HMO) | Attending: Emergency Medicine | Admitting: Emergency Medicine

## 2016-06-26 ENCOUNTER — Emergency Department (HOSPITAL_COMMUNITY)
Admission: EM | Admit: 2016-06-26 | Discharge: 2016-06-26 | Disposition: A | Discharge status: Home / Self Care | Payer: Managed Care, Other (non HMO) | Source: Home / Self Care

## 2016-06-26 ENCOUNTER — Emergency Department (HOSPITAL_COMMUNITY): Payer: Managed Care, Other (non HMO)

## 2016-06-26 DIAGNOSIS — R51 Headache: Secondary | ICD-10-CM | POA: Insufficient documentation

## 2016-06-26 DIAGNOSIS — F17210 Nicotine dependence, cigarettes, uncomplicated: Secondary | ICD-10-CM | POA: Insufficient documentation

## 2016-06-26 DIAGNOSIS — I10 Essential (primary) hypertension: Secondary | ICD-10-CM | POA: Insufficient documentation

## 2016-06-26 DIAGNOSIS — Z79899 Other long term (current) drug therapy: Secondary | ICD-10-CM | POA: Insufficient documentation

## 2016-06-26 DIAGNOSIS — R519 Headache, unspecified: Secondary | ICD-10-CM

## 2016-06-26 LAB — URINALYSIS, ROUTINE W REFLEX MICROSCOPIC
Bacteria, UA: NONE SEEN
Bilirubin Urine: NEGATIVE
Glucose, UA: NEGATIVE mg/dL
Ketones, ur: NEGATIVE mg/dL
Leukocytes, UA: NEGATIVE
NITRITE: NEGATIVE
PH: 6 (ref 5.0–8.0)
PROTEIN: 100 mg/dL — AB
SPECIFIC GRAVITY, URINE: 1.02 (ref 1.005–1.030)

## 2016-06-26 LAB — COMPREHENSIVE METABOLIC PANEL
ALBUMIN: 4.4 g/dL (ref 3.5–5.0)
ALK PHOS: 62 U/L (ref 38–126)
ALT: 21 U/L (ref 14–54)
ANION GAP: 13 (ref 5–15)
AST: 23 U/L (ref 15–41)
BILIRUBIN TOTAL: 0.5 mg/dL (ref 0.3–1.2)
BUN: 11 mg/dL (ref 6–20)
CALCIUM: 9.7 mg/dL (ref 8.9–10.3)
CO2: 24 mmol/L (ref 22–32)
CREATININE: 0.79 mg/dL (ref 0.44–1.00)
Chloride: 97 mmol/L — ABNORMAL LOW (ref 101–111)
GFR calc Af Amer: >60 mL/min (ref >60)
GFR calc non Af Amer: >60 mL/min (ref >60)
GLUCOSE: 131 mg/dL — AB (ref 65–99)
Potassium: 3.3 mmol/L — ABNORMAL LOW (ref 3.5–5.1)
Sodium: 134 mmol/L — ABNORMAL LOW (ref 135–145)
TOTAL PROTEIN: 8.1 g/dL (ref 6.5–8.1)

## 2016-06-26 LAB — CBC
HCT: 43.6 % (ref 36.0–46.0)
HEMOGLOBIN: 14.8 g/dL (ref 12.0–15.0)
MCH: 27.2 pg (ref 26.0–34.0)
MCHC: 33.9 g/dL (ref 30.0–36.0)
MCV: 80.1 fL (ref 78.0–100.0)
PLATELETS: 209 10*3/uL (ref 150–400)
RBC: 5.44 MIL/uL — ABNORMAL HIGH (ref 3.87–5.11)
RDW: 12.8 % (ref 11.5–15.5)
WBC: 4.5 10*3/uL (ref 4.0–10.5)

## 2016-06-26 LAB — LIPASE, BLOOD: Lipase: 18 U/L (ref 11–51)

## 2016-06-26 LAB — I-STAT BETA HCG BLOOD, ED (MC, WL, AP ONLY): I-stat hCG, quantitative: <5 m[IU]/mL (ref <5)

## 2016-06-26 MED ORDER — HYDROMORPHONE HCL 2 MG/ML IJ SOLN
1.0000 mg | Freq: Once | INTRAMUSCULAR | Status: AC
  Administered 2016-06-26: 1 mg via INTRAVENOUS
  Filled 2016-06-26: qty 1

## 2016-06-26 MED ORDER — PROMETHAZINE HCL 25 MG PO TABS: 25.0000 mg | ORAL_TABLET | Freq: Four times a day (QID) | ORAL | 0 refills | Status: DC | PRN

## 2016-06-26 MED ORDER — SODIUM CHLORIDE 0.9 % IV BOLUS (SEPSIS)
1000.0000 mL | Freq: Once | INTRAVENOUS | Status: AC
  Administered 2016-06-26: 1000 mL via INTRAVENOUS

## 2016-06-26 MED ORDER — METOCLOPRAMIDE HCL 5 MG/ML IJ SOLN
10.0000 mg | Freq: Once | INTRAMUSCULAR | Status: AC
Start: 2016-06-26 — End: 2016-06-26
  Administered 2016-06-26: 10 mg via INTRAVENOUS
  Filled 2016-06-26: qty 2

## 2016-06-26 MED ORDER — KETOROLAC TROMETHAMINE 30 MG/ML IJ SOLN
30.0000 mg | Freq: Once | INTRAMUSCULAR | Status: AC
  Administered 2016-06-26: 30 mg via INTRAVENOUS
  Filled 2016-06-26: qty 1

## 2016-06-26 MED ORDER — BUTALBITAL-APAP-CAFFEINE 50-325-40 MG PO TABS: 1.0000 | ORAL_TABLET | Freq: Four times a day (QID) | ORAL | 0 refills | Status: AC | PRN

## 2016-06-26 MED ORDER — DIPHENHYDRAMINE HCL 50 MG/ML IJ SOLN
25.0000 mg | Freq: Once | INTRAMUSCULAR | Status: AC
  Administered 2016-06-26: 25 mg via INTRAVENOUS
  Filled 2016-06-26: qty 1

## 2016-06-26 MED ORDER — ACETAMINOPHEN 500 MG PO TABS
1000.0000 mg | ORAL_TABLET | Freq: Once | ORAL | Status: AC
  Administered 2016-06-26: 1000 mg via ORAL
  Filled 2016-06-26: qty 2

## 2016-06-26 NOTE — ED Notes (Signed)
Patient did not answer when called to be roomed.  

## 2016-06-26 NOTE — ED Notes (Signed)
Patient did not answer for rooming

## 2016-06-26 NOTE — ED Triage Notes (Signed)
Pt c/o headache onset Friday. Pt reports dizziness and vomiting onset earlier last night. Pt was checked in last night but did not want to wait.

## 2016-06-26 NOTE — Discharge Instructions (Signed)
CT scan of your head was normal. Medication for pain and nausea. Increase fluids. Rest. Return if worse.

## 2016-06-26 NOTE — ED Notes (Signed)
Pt called for for vital sign reassessment, No answer 

## 2016-06-26 NOTE — ED Provider Notes (Signed)
[facility] Provider Note   CSN: [id number] Arrival date & time: 06/26/16  [id number]     History   Chief Complaint Chief Complaint  Patient presents with  . Headache  . Dizziness  . Emesis    HPI Elizabeth Rogers is a 41 y.o. female.  Patient has a bifrontal headache for the past several days.  No stiff neck or neurological deficits. No fever, chills, maxillary sinus pressure. Review systems positive for minimal dizziness and nausea. She has tried nothing for the pain at home. Severity is mild to moderate.      Past Medical History:  Diagnosis Date  . Headache(784.0)   . Hypertension     There are no active problems to display for this patient.   History reviewed. No pertinent surgical history.  OB History    No data available       Home Medications    Prior to Admission medications   Medication Sig Start Date End Date Taking? Authorizing Provider  amLODipine (NORVASC) 10 MG tablet Take 10 mg by mouth daily. 06/25/16  Yes Historical Provider, MD  metoprolol tartrate (LOPRESSOR) 25 MG tablet Take 25 mg by mouth every morning.   Yes Historical Provider, MD  [facility] Johns Wort 150 MG CAPS Take 150 mg by mouth every morning.   Yes Historical Provider, MD  Vitamin D, Ergocalciferol, (DRISDOL) 50000 units CAPS capsule Take 50,000 Units by mouth once a week. Sundays 03/21/16  Yes Historical Provider, MD  butalbital-acetaminophen-caffeine (FIORICET, ESGIC) 50-325-40 MG tablet Take 1-2 tablets by mouth every 6 (six) hours as needed for headache. 06/26/16 06/26/17  Donnetta HutchingBrian Traven Davids, MD  promethazine (PHENERGAN) 25 MG tablet Take 1 tablet (25 mg total) by mouth every 6 (six) hours as needed for nausea or vomiting. 06/26/16   Donnetta HutchingBrian Sanaai Doane, MD    Family History No family history on file.  Social History Social History  Substance Use Topics  . Smoking status: Current Every Day Smoker    Packs/day: 0.50    Types: Cigarettes  . Smokeless tobacco: Never Used  . Alcohol use Yes   Comment: occ     Allergies   Patient has no known allergies.   Review of Systems Review of Systems  All other systems reviewed and are negative.    Physical Exam Updated Vital Signs BP 131/89   Pulse 83   Temp 98.6 F (37 C) (Oral)   Ht 5\' 11"  (1.803 m)   Wt 245 lb (111.1 kg)   LMP 06/24/2016   SpO2 99%   BMI 34.17 kg/m   Physical Exam  Constitutional: She is oriented to person, place, and time. She appears well-developed and well-nourished.  Color, no apparent distress.  HENT:  Head: Normocephalic and atraumatic.  Eyes: Conjunctivae are normal.  Neck: Neck supple.  Cardiovascular: Normal rate and regular rhythm.   Pulmonary/Chest: Effort normal and breath sounds normal.  Abdominal: Soft. Bowel sounds are normal.  Musculoskeletal: Normal range of motion. Tenderness:    Neurological: She is alert and oriented to person, place, and time. Cranial nerve deficit:    Skin: Skin is warm and dry.  Psychiatric: She has a normal mood and affect. Her behavior is normal.  Nursing note and vitals reviewed.    ED Treatments / Results  Labs (all labs ordered are listed, but only abnormal results are displayed) Labs Reviewed  COMPREHENSIVE METABOLIC PANEL - Abnormal; Notable for the following:       Result Value   Sodium 134 (*)  Potassium 3.3 (*)    Chloride 97 (*)    Glucose, Bld 131 (*)    All other components within normal limits  CBC - Abnormal; Notable for the following:    RBC 5.44 (*)    All other components within normal limits  URINALYSIS, ROUTINE W REFLEX MICROSCOPIC - Abnormal; Notable for the following:    APPearance HAZY (*)    Hgb urine dipstick MODERATE (*)    Protein, ur 100 (*)    Squamous Epithelial / LPF 0-5 (*)    All other components within normal limits  LIPASE, BLOOD  I-STAT BETA HCG BLOOD, ED ([facility], [facility], [facility] ONLY)    EKG  EKG Interpretation  Date/Time:  Tuesday June 26 2016 06:53:52 EST Ventricular Rate:  76 PR Interval:  180 QRS  Duration: 88 QT Interval:  436 QTC Calculation: 490 R Axis:   110 Text Interpretation:   Suspect arm lead reversal, interpretation assumes no reversal Unusual P axis, possible ectopic atrial rhythm Right axis deviation Septal infarct , age undetermined Abnormal ECG Confirmed by Jaylin Roundy  MD, Cesario Weidinger ([id number]) on 06/26/2016 10:44:53 AM       Radiology Ct Head Wo Contrast  Result Date: 06/26/2016 CLINICAL DATA:  Persistent frontal headache EXAM: CT HEAD WITHOUT CONTRAST TECHNIQUE: Contiguous axial images were obtained from the base of the skull through the vertex without intravenous contrast. COMPARISON:  01/09/2016 FINDINGS: Brain: No evidence of acute infarction, hemorrhage, hydrocephalus, extra-axial collection or mass lesion/mass effect. Vascular: No hyperdense vessel or unexpected calcification. Skull: Normal. Negative for fracture or focal lesion. Sinuses/Orbits: No acute finding. Other: Basal ganglia calcifications are again noted on the left. IMPRESSION: No acute abnormality noted. Electronically Signed   By: Alcide Clever M.D.   On: 06/26/2016 11:27    Procedures Procedures (including critical care time)  Medications Ordered in ED Medications  acetaminophen (TYLENOL) tablet 1,000 mg (1,000 mg Oral Given 06/26/16 0917)  sodium chloride 0.9 % bolus 1,000 mL (0 mLs Intravenous Stopped 06/26/16 1207)  ketorolac (TORADOL) 30 MG/ML injection 30 mg (30 mg Intravenous Given 06/26/16 1025)  metoCLOPramide (REGLAN) injection 10 mg (10 mg Intravenous Given 06/26/16 1025)  diphenhydrAMINE (BENADRYL) injection 25 mg (25 mg Intravenous Given 06/26/16 1025)  HYDROmorphone (DILAUDID) injection 1 mg (1 mg Intravenous Given 06/26/16 1132)  sodium chloride 0.9 % bolus 1,000 mL (0 mLs Intravenous Stopped 06/26/16 1307)     Initial Impression / Assessment and Plan / ED Course  I have reviewed the triage vital signs and the nursing notes.  Pertinent labs & imaging results that were available during my care of the  patient were reviewed by me and considered in my medical decision making (see chart for details).     Patient has a normal neurological exam. CT head was negative. Patient feels better after IV fluids and pain medication. Discharge medications Fioricet and Phenergan 25 mg  Final Clinical Impressions(s) / ED Diagnoses   Final diagnoses:  Intractable headache, unspecified chronicity pattern, unspecified headache type    New Prescriptions New Prescriptions   BUTALBITAL-ACETAMINOPHEN-CAFFEINE (FIORICET, ESGIC) 50-325-40 MG TABLET    Take 1-2 tablets by mouth every 6 (six) hours as needed for headache.   PROMETHAZINE (PHENERGAN) 25 MG TABLET    Take 1 tablet (25 mg total) by mouth every 6 (six) hours as needed for nausea or vomiting.     Donnetta Hutching, MD 06/26/16 [id number]

## 2017-06-10 ENCOUNTER — Ambulatory Visit (HOSPITAL_COMMUNITY)
Admission: EM | Admit: 2017-06-10 | Discharge: 2017-06-10 | Disposition: A | Discharge status: Home / Self Care | Payer: Managed Care, Other (non HMO)

## 2017-06-16 ENCOUNTER — Encounter (HOSPITAL_COMMUNITY): Payer: Self-pay

## 2017-06-16 ENCOUNTER — Emergency Department (HOSPITAL_COMMUNITY): Payer: No Typology Code available for payment source

## 2017-06-16 ENCOUNTER — Emergency Department (HOSPITAL_COMMUNITY)
Admission: EM | Admit: 2017-06-16 | Discharge: 2017-06-16 | Disposition: A | Discharge status: Home / Self Care | Payer: No Typology Code available for payment source | Attending: Emergency Medicine | Admitting: Emergency Medicine

## 2017-06-16 DIAGNOSIS — M545 Low back pain: Secondary | ICD-10-CM | POA: Diagnosis present

## 2017-06-16 DIAGNOSIS — Z79899 Other long term (current) drug therapy: Secondary | ICD-10-CM | POA: Diagnosis not present

## 2017-06-16 DIAGNOSIS — F17210 Nicotine dependence, cigarettes, uncomplicated: Secondary | ICD-10-CM | POA: Diagnosis not present

## 2017-06-16 DIAGNOSIS — I10 Essential (primary) hypertension: Secondary | ICD-10-CM | POA: Diagnosis not present

## 2017-06-16 LAB — POC URINE PREG, ED: PREG TEST UR: NEGATIVE

## 2017-06-16 MED ORDER — CYCLOBENZAPRINE HCL 10 MG PO TABS: 10.0000 mg | ORAL_TABLET | Freq: Two times a day (BID) | ORAL | 0 refills | Status: DC | PRN

## 2017-06-16 NOTE — Discharge Instructions (Signed)
The x-ray of your spine is reassuring.  No fracture.  Please take ibuprofen 800 mg every 6 hours as needed for pain.  You can also take Tylenol.  I have written a prescription for muscle relaxer medicine called Flexeril.  This medicine can make you drowsy so please do not drive, work or drink alcohol while taking it.  You can also apply heat to the lower back to help you with your symptoms.  Please schedule an appointment with your OB/GYN for further evaluation of your breast lump.  Return to the emergency department if you develop any new or worsening symptoms.
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
## 2017-06-16 NOTE — ED Notes (Signed)
Declined W/C at D/C and was escorted to lobby by RN. 
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
## 2017-12-29 ENCOUNTER — Emergency Department (HOSPITAL_COMMUNITY)
Admission: EM | Admit: 2017-12-29 | Discharge: 2017-12-29 | Disposition: A | Discharge status: Home / Self Care | Payer: Managed Care, Other (non HMO) | Attending: Emergency Medicine | Admitting: Emergency Medicine

## 2017-12-29 ENCOUNTER — Encounter (HOSPITAL_COMMUNITY): Payer: Self-pay

## 2017-12-29 DIAGNOSIS — K5900 Constipation, unspecified: Secondary | ICD-10-CM | POA: Insufficient documentation

## 2017-12-29 DIAGNOSIS — Z5321 Procedure and treatment not carried out due to patient leaving prior to being seen by health care provider: Secondary | ICD-10-CM | POA: Insufficient documentation

## 2017-12-29 NOTE — ED Notes (Signed)
Called in waiting room for vitals check with no answer

## 2017-12-29 NOTE — ED Notes (Signed)
Called in waiting room with no answer 

## 2017-12-29 NOTE — ED Triage Notes (Signed)
Last BM 1 1/2 to 2 weeks ago.  Pt seen at PCP 12-23-17 was prescribed ? Med,  increased the dose next day, seen again was prescribed miralax, was told today to drink prune juice. Has not taken BP meds today.

## 2017-12-29 NOTE — ED Notes (Signed)
Pt called for vitals recheck, no answer.  

## 2017-12-31 NOTE — ED Notes (Signed)
Follow up call made no answer   12/31/17  1337  s Chukwuemeka Artola rn

## 2018-01-06 ENCOUNTER — Other Ambulatory Visit: Payer: Self-pay

## 2018-01-06 ENCOUNTER — Encounter (HOSPITAL_COMMUNITY): Payer: Self-pay

## 2018-01-06 ENCOUNTER — Emergency Department (HOSPITAL_COMMUNITY): Payer: Managed Care, Other (non HMO)

## 2018-01-06 ENCOUNTER — Emergency Department (HOSPITAL_COMMUNITY)
Admission: EM | Admit: 2018-01-06 | Discharge: 2018-01-06 | Disposition: A | Discharge status: Home / Self Care | Payer: Managed Care, Other (non HMO) | Attending: Emergency Medicine | Admitting: Emergency Medicine

## 2018-01-06 DIAGNOSIS — I10 Essential (primary) hypertension: Secondary | ICD-10-CM | POA: Insufficient documentation

## 2018-01-06 DIAGNOSIS — F17210 Nicotine dependence, cigarettes, uncomplicated: Secondary | ICD-10-CM | POA: Insufficient documentation

## 2018-01-06 DIAGNOSIS — A5900 Urogenital trichomoniasis, unspecified: Secondary | ICD-10-CM | POA: Insufficient documentation

## 2018-01-06 DIAGNOSIS — R1084 Generalized abdominal pain: Secondary | ICD-10-CM | POA: Diagnosis present

## 2018-01-06 DIAGNOSIS — N760 Acute vaginitis: Secondary | ICD-10-CM | POA: Insufficient documentation

## 2018-01-06 DIAGNOSIS — Z79899 Other long term (current) drug therapy: Secondary | ICD-10-CM | POA: Diagnosis not present

## 2018-01-06 DIAGNOSIS — B9689 Other specified bacterial agents as the cause of diseases classified elsewhere: Secondary | ICD-10-CM

## 2018-01-06 DIAGNOSIS — A599 Trichomoniasis, unspecified: Secondary | ICD-10-CM

## 2018-01-06 LAB — COMPREHENSIVE METABOLIC PANEL
ALT: 38 U/L (ref 0–44)
ANION GAP: 7 (ref 5–15)
AST: 28 U/L (ref 15–41)
Albumin: 3.5 g/dL (ref 3.5–5.0)
Alkaline Phosphatase: 64 U/L (ref 38–126)
BUN: 11 mg/dL (ref 6–20)
CALCIUM: 8.8 mg/dL — AB (ref 8.9–10.3)
CHLORIDE: 106 mmol/L (ref 98–111)
CO2: 25 mmol/L (ref 22–32)
Creatinine, Ser: 0.95 mg/dL (ref 0.44–1.00)
GFR calc Af Amer: >60 mL/min (ref >60)
Glucose, Bld: 88 mg/dL (ref 70–99)
Potassium: 3.5 mmol/L (ref 3.5–5.1)
SODIUM: 138 mmol/L (ref 135–145)
Total Bilirubin: 0.8 mg/dL (ref 0.3–1.2)
Total Protein: 6.2 g/dL — ABNORMAL LOW (ref 6.5–8.1)

## 2018-01-06 LAB — URINALYSIS, ROUTINE W REFLEX MICROSCOPIC
GLUCOSE, UA: NEGATIVE mg/dL
Ketones, ur: NEGATIVE mg/dL
Nitrite: NEGATIVE
PH: 6 (ref 5.0–8.0)
PROTEIN: 100 mg/dL — AB
Specific Gravity, Urine: >1.03 — ABNORMAL HIGH (ref 1.005–1.030)

## 2018-01-06 LAB — WET PREP, GENITAL
SPERM: NONE SEEN
Yeast Wet Prep HPF POC: NONE SEEN

## 2018-01-06 LAB — CBC
HEMATOCRIT: 45.7 % (ref 36.0–46.0)
Hemoglobin: 15.2 g/dL — ABNORMAL HIGH (ref 12.0–15.0)
MCH: 29 pg (ref 26.0–34.0)
MCHC: 33.3 g/dL (ref 30.0–36.0)
MCV: 87 fL (ref 78.0–100.0)
PLATELETS: 240 10*3/uL (ref 150–400)
RBC: 5.25 MIL/uL — AB (ref 3.87–5.11)
RDW: 14.2 % (ref 11.5–15.5)
WBC: 7.5 10*3/uL (ref 4.0–10.5)

## 2018-01-06 LAB — URINALYSIS, MICROSCOPIC (REFLEX)

## 2018-01-06 LAB — I-STAT BETA HCG BLOOD, ED (MC, WL, AP ONLY): I-stat hCG, quantitative: <5 m[IU]/mL (ref <5)

## 2018-01-06 LAB — LIPASE, BLOOD: LIPASE: 26 U/L (ref 11–51)

## 2018-01-06 MED ORDER — AZITHROMYCIN 250 MG PO TABS
1000.0000 mg | ORAL_TABLET | Freq: Once | ORAL | Status: AC
  Administered 2018-01-06: 1000 mg via ORAL
  Filled 2018-01-06: qty 4

## 2018-01-06 MED ORDER — METRONIDAZOLE 500 MG PO TABS: 500.0000 mg | ORAL_TABLET | Freq: Two times a day (BID) | ORAL | 0 refills | Status: AC

## 2018-01-06 MED ORDER — LIDOCAINE HCL (PF) 1 % IJ SOLN
INTRAMUSCULAR | Status: AC
  Filled 2018-01-06: qty 30

## 2018-01-06 MED ORDER — IOHEXOL 300 MG/ML  SOLN
100.0000 mL | Freq: Once | INTRAMUSCULAR | Status: AC | PRN
  Administered 2018-01-06: 100 mL via INTRAVENOUS

## 2018-01-06 MED ORDER — CEFTRIAXONE SODIUM 250 MG IJ SOLR
250.0000 mg | Freq: Once | INTRAMUSCULAR | Status: AC
  Administered 2018-01-06: 250 mg via INTRAMUSCULAR
  Filled 2018-01-06: qty 250

## 2018-01-06 MED ORDER — IOPAMIDOL (ISOVUE-300) INJECTION 61%
INTRAVENOUS | Status: AC
  Filled 2018-01-06: qty 100

## 2018-01-06 NOTE — ED Provider Notes (Signed)
[facility]-EMERGENCY DEPT Provider Note   CSN: [id number] Arrival date & time: 01/06/18  1050     History   Chief Complaint Chief Complaint  Patient presents with  . Constipation  . Bloated  . Hypertension    HPI Elizabeth Rogers is a 42 y.o. female.  The history is provided by the patient.  Abdominal Pain   This is a new problem. The current episode started more than 1 week ago. The problem occurs constantly. The problem has been gradually worsening. The pain is associated with eating. The pain is located in the generalized abdominal region. The quality of the pain is aching and dull. The pain is at a severity of 7/10. The pain is moderate. Associated symptoms include nausea and constipation. Pertinent negatives include anorexia, fever, belching, diarrhea, flatus, melena, vomiting, dysuria, hematuria, headaches, arthralgias and myalgias. Nothing aggravates the symptoms. Nothing relieves the symptoms. Past workup does not include GI consult.    Past Medical History:  Diagnosis Date  . Headache(784.0)   . Hypertension     There are no active problems to display for this patient.   History reviewed. No pertinent surgical history.   OB History   None      Home Medications    Prior to Admission medications   Medication Sig Start Date End Date Taking? Authorizing Provider  hydrochlorothiazide (MICROZIDE) 12.5 MG capsule Take 12.5 mg by mouth 2 (two) times daily.   Yes [provider]  metoprolol succinate (TOPROL-XL) 25 MG 24 hr tablet Take 25 mg by mouth daily.   Yes [provider]  metroNIDAZOLE (FLAGYL) 500 MG tablet Take 1 tablet (500 mg total) by mouth 2 (two) times daily for 7 days. 01/06/18 01/13/18  Virgina Norfolk, DO    Family History History reviewed. No pertinent family history.  Social History Social History   Tobacco Use  . Smoking status: Current Every Day Smoker    Packs/day: 0.50    Types: Cigarettes  .  Smokeless tobacco: Never Used  Substance Use Topics  . Alcohol use: Yes    Comment: occ  . Drug use: No     Allergies   Patient has no known allergies.   Review of Systems Review of Systems  Constitutional: Negative for chills and fever.  HENT: Negative for ear pain and sore throat.   Eyes: Negative for pain and visual disturbance.  Respiratory: Negative for cough and shortness of breath.   Cardiovascular: Negative for chest pain and palpitations.  Gastrointestinal: Positive for abdominal distention, abdominal pain, constipation and nausea. Negative for anorexia, diarrhea, flatus, melena and vomiting.  Genitourinary: Negative for dysuria and hematuria.  Musculoskeletal: Negative for arthralgias, back pain and myalgias.  Skin: Negative for color change and rash.  Neurological: Negative for seizures, syncope and headaches.  All other systems reviewed and are negative.    Physical Exam Updated Vital Signs  ED Triage Vitals  Enc Vitals Group     BP 01/06/18 1058 (!) 171/133     Pulse Rate 01/06/18 1058 100     Resp 01/06/18 1058 20     Temp 01/06/18 1058 98.5 F (36.9 C)     Temp Source 01/06/18 1058 Oral     SpO2 01/06/18 1058 100 %     Weight 01/06/18 1058 236 lb (107 kg)     Height 01/06/18 1058 5\' 11"  (1.803 m)     Head Circumference --      Peak Flow --  Pain Score 01/06/18 1104 9     Pain Loc --      Pain Edu? --      Excl. in GC? --    Physical Exam  Constitutional: She is oriented to person, place, and time. She appears well-developed and well-nourished. No distress.  HENT:  Head: Normocephalic and atraumatic.  Eyes: Pupils are equal, round, and reactive to light. Conjunctivae are normal.  Neck: Normal range of motion. Neck supple.  Cardiovascular: Normal rate, regular rhythm, normal heart sounds and intact distal pulses.  No murmur heard. Pulmonary/Chest: Effort normal and breath sounds normal. No respiratory distress.  Abdominal: Soft. She exhibits  distension. There is tenderness.  Musculoskeletal: Normal range of motion. She exhibits no edema.  Neurological: She is alert and oriented to person, place, and time.  Skin: Skin is warm and dry. Capillary refill takes less than 2 seconds.  Psychiatric: She has a normal mood and affect.  Nursing note and vitals reviewed.    ED Treatments / Results  Labs (all labs ordered are listed, but only abnormal results are displayed) Labs Reviewed  WET PREP, GENITAL - Abnormal; Notable for the following components:      Result Value   Trich, Wet Prep PRESENT (*)    Clue Cells Wet Prep HPF POC PRESENT (*)    WBC, Wet Prep HPF POC FEW (*)    All other components within normal limits  CBC - Abnormal; Notable for the following components:   RBC 5.25 (*)    Hemoglobin 15.2 (*)    All other components within normal limits  URINALYSIS, ROUTINE W REFLEX MICROSCOPIC - Abnormal; Notable for the following components:   Specific Gravity, Urine >1.030 (*)    Hgb urine dipstick SMALL (*)    Bilirubin Urine SMALL (*)    Protein, ur 100 (*)    Leukocytes, UA SMALL (*)    All other components within normal limits  COMPREHENSIVE METABOLIC PANEL - Abnormal; Notable for the following components:   Calcium 8.8 (*)    Total Protein 6.2 (*)    All other components within normal limits  URINALYSIS, MICROSCOPIC (REFLEX) - Abnormal; Notable for the following components:   Bacteria, UA MANY (*)    Trichomonas, UA PRESENT (*)    All other components within normal limits  LIPASE, BLOOD  HIV ANTIBODY (ROUTINE TESTING)  HIV 1 RNA QUANT-NO REFLEX-BLD  I-STAT BETA HCG BLOOD, ED ([facility], [facility], [facility] ONLY)  GC/CHLAMYDIA PROBE AMP ([place]) NOT AT [facility]    EKG EKG Interpretation  Date/Time:  Monday January 06 2018 11:32:36 EDT Ventricular Rate:  95 PR Interval:    QRS Duration: 84 QT Interval:  388 QTC Calculation: 488 R Axis:   88 Text Interpretation:  Sinus rhythm Left atrial enlargement Probable left ventricular  hypertrophy Borderline T abnormalities, lateral leads Confirmed by Virgina Norfolk [phone]) on 01/06/2018 12:16:46 PM   Radiology Ct Abdomen Pelvis W Contrast  Result Date: 01/06/2018 CLINICAL DATA:  Small bowel movement today. Radiographs indicate that she is constipated. Patient portions given prep for colonoscopy but had a small amount of liquid stool last week. EXAM: CT ABDOMEN AND PELVIS WITH CONTRAST TECHNIQUE: Multidetector CT imaging of the abdomen and pelvis was performed using the standard protocol following bolus administration of intravenous contrast. CONTRAST:  OMNIPAQUE IOHEXOL 300 MG/ML  SOLN COMPARISON:  None. FINDINGS: Lower chest: Top-normal size heart without pericardial effusion. Trace to small bilateral pleural effusions, right greater than left. Linear subsegmental atelectasis and/or scarring  in the left lower lobe. Hepatobiliary: No focal liver abnormality is seen. No gallstones, gallbladder wall thickening, or biliary dilatation. Pancreas: Unremarkable. No pancreatic ductal dilatation or surrounding inflammatory changes. Spleen: Normal in size without focal abnormality. Adrenals/Urinary Tract: Adrenal glands are unremarkable. Kidneys are normal, without renal calculi, focal lesion, or hydronephrosis. Bladder is unremarkable. Small lower pelvic calcifications compatible phleboliths are noted bilaterally. Stomach/Bowel: The stomach is decompressed. The duodenal sweep and ligament of Treitz are normal. No small bowel obstruction or inflammation. Average amount of fecal retention is noted within the colon. Appendix is normal in caliber and air-filled. No large bowel inflammatory change or obstruction is seen. Vascular/Lymphatic: No significant vascular findings are present. No enlarged abdominal or pelvic lymph nodes. Reproductive: IUD noted in the endometrial cavity. Simple appearing right adnexal cyst measuring 4 x 2.7 x 3.6 cm likely ovarian in etiology without worrisome features.  Trace associated free fluid in the pelvis, likely physiologic. The uterus demonstrates no mass. Left adnexa is unremarkable. Other: No free air.  Trace periumbilical fat containing hernia. Musculoskeletal: Small subcutaneous lipoma in the left lower quadrant measuring 1.5 cm in diameter. No aggressive nor acute osseous abnormality. IMPRESSION: 1. No bowel obstruction or inflammation. Stool retention does not appear significantly increased within the colon. 2. Trace to small bilateral pleural effusions, right greater than left without acute pulmonary consolidations. Minimal scarring or atelectasis is noted in the left lower lobe. 3. Simple appearing right ovarian cyst measuring 4 x 2.7 x 3.6 cm. Trace physiologic fluid in the cul-de-sac. IUD in place. 4. Tiny periumbilical fat containing hernia. Small left lower quadrant subcutaneous measuring 1.5 cm in diameter. Electronically Signed   By: Tollie Eth M.D.   On: 01/06/2018 14:01    Procedures Procedures (including critical care time)  Medications Ordered in ED Medications  iopamidol (ISOVUE-300) 61 % injection (has no administration in time range)  lidocaine (PF) (XYLOCAINE) 1 % injection (has no administration in time range)  iohexol (OMNIPAQUE) 300 MG/ML solution 100 mL (100 mLs Intravenous Contrast Given 01/06/18 1339)  cefTRIAXone (ROCEPHIN) injection 250 mg (250 mg Intramuscular Given 01/06/18 1443)  azithromycin (ZITHROMAX) tablet 1,000 mg (1,000 mg Oral Given 01/06/18 1443)     Initial Impression / Assessment and Plan / ED Course  I have reviewed the triage vital signs and the nursing notes.  Pertinent labs & imaging results that were available during my care of the patient were reviewed by me and considered in my medical decision making (see chart for details).     Tanequa Kretz is a 42 year old female with history of hypertension, headache who presents to the ED with abdominal pain, bloating.  Patient with unremarkable vitals.  No fever.   Patient with symptoms over the last 11 days and believes that it is associated with constipation.  Patient is not passing gas.  Denies any nausea, vomiting.  Has diffuse abdominal tenderness.  Has tried multiple over-the-counter constipation medications without much relief.  Patient denies any fever, chills.  Patient has diffuse tenderness on abdominal exam.  Otherwise is well-appearing.  Lab work shows normal lipase and no concern for pancreatitis.  No significant anemia, electrolyte abnormality, kidney injury.  Urinalysis shows no signs of infection.  CT abdomen and pelvis shows no acute findings.  No obstruction.    Urinalysis was positive for trichomonas and thus had further discussion with patient and she does admit to some vaginal discharge.  Pelvic exam shows scant vaginal discharge.  No cervical motion tenderness and no concern for  PID.  Patient with positive bacterial vaginosis as well.  Patient was treated empirically for gonorrhea and chlamydia with Rocephin and azithromycin.  Given Flagyl for trichomonas.  HIV testing ordered per patient request and will follow-up with primary care doctor.  Patient had negative pregnancy test.  Recommend continued use of MiraLAX as needed and patient was discharged from ED in good condition.  Told to return to ED if symptoms worsen recommend and follow-up with primary care doctor.  Final Clinical Impressions(s) / ED Diagnoses   Final diagnoses:  Trichomoniasis  Bacterial vaginosis    ED Discharge Orders         Ordered    metroNIDAZOLE (FLAGYL) 500 MG tablet  2 times daily     01/06/18 1503           Virgina NorfolkCuratolo, Constanza Mincy, DO 01/06/18 1550

## 2018-01-06 NOTE — ED Notes (Signed)
Pt updated on plan of care and reason for delay on CT. Pt requesting water. Pt informed after CT this RN would ask MD about PO fluids.

## 2018-01-06 NOTE — ED Triage Notes (Addendum)
Patient states she had a small BM today. Patient reports that she had an x-ray that showed she was constipated. patient reports that she was given the prep for a colonoscopy,but had a small amount of liquid stool last week.  Patient hypertensive in triage-165/133. Patient states she did not take her BP meds today.

## 2018-01-06 NOTE — ED Notes (Signed)
Pt ambulating to bathroom.

## 2018-01-06 NOTE — ED Notes (Signed)
Called lab to add on Lipase.  

## 2018-01-07 LAB — HIV-1 RNA QUANT-NO REFLEX-BLD: LOG10 HIV-1 RNA: UNDETERMINED log10copy/mL

## 2018-01-07 LAB — GC/CHLAMYDIA PROBE AMP (~~LOC~~) NOT AT ARMC
CHLAMYDIA, DNA PROBE: NEGATIVE
NEISSERIA GONORRHEA: NEGATIVE

## 2018-01-07 LAB — HIV ANTIBODY (ROUTINE TESTING W REFLEX): HIV SCREEN 4TH GENERATION: NONREACTIVE

## 2018-04-14 ENCOUNTER — Emergency Department (HOSPITAL_COMMUNITY)
Admission: EM | Admit: 2018-04-14 | Discharge: 2018-04-14 | Disposition: A | Discharge status: Home / Self Care | Payer: BLUE CROSS/BLUE SHIELD | Attending: Emergency Medicine | Admitting: Emergency Medicine

## 2018-04-14 ENCOUNTER — Encounter (HOSPITAL_COMMUNITY): Payer: Self-pay

## 2018-04-14 DIAGNOSIS — I10 Essential (primary) hypertension: Secondary | ICD-10-CM | POA: Diagnosis not present

## 2018-04-14 DIAGNOSIS — Z79899 Other long term (current) drug therapy: Secondary | ICD-10-CM | POA: Insufficient documentation

## 2018-04-14 DIAGNOSIS — R200 Anesthesia of skin: Secondary | ICD-10-CM | POA: Insufficient documentation

## 2018-04-14 DIAGNOSIS — R202 Paresthesia of skin: Secondary | ICD-10-CM | POA: Diagnosis not present

## 2018-04-14 DIAGNOSIS — E876 Hypokalemia: Secondary | ICD-10-CM | POA: Insufficient documentation

## 2018-04-14 DIAGNOSIS — Z733 Stress, not elsewhere classified: Secondary | ICD-10-CM | POA: Diagnosis not present

## 2018-04-14 DIAGNOSIS — F17210 Nicotine dependence, cigarettes, uncomplicated: Secondary | ICD-10-CM | POA: Diagnosis not present

## 2018-04-14 DIAGNOSIS — R42 Dizziness and giddiness: Secondary | ICD-10-CM | POA: Diagnosis present

## 2018-04-14 LAB — BASIC METABOLIC PANEL
Anion gap: 8 (ref 5–15)
BUN: 12 mg/dL (ref 6–20)
CO2: 27 mmol/L (ref 22–32)
Calcium: 8.7 mg/dL — ABNORMAL LOW (ref 8.9–10.3)
Chloride: 104 mmol/L (ref 98–111)
Creatinine, Ser: 0.79 mg/dL (ref 0.44–1.00)
GFR calc Af Amer: >60 mL/min (ref >60)
GFR calc non Af Amer: >60 mL/min (ref >60)
Glucose, Bld: 109 mg/dL — ABNORMAL HIGH (ref 70–99)
Potassium: 3.3 mmol/L — ABNORMAL LOW (ref 3.5–5.1)
Sodium: 139 mmol/L (ref 135–145)

## 2018-04-14 LAB — CBC WITH DIFFERENTIAL/PLATELET
Abs Immature Granulocytes: 0.02 10*3/uL (ref 0.00–0.07)
Basophils Absolute: 0 10*3/uL (ref 0.0–0.1)
Basophils Relative: 0 %
Eosinophils Absolute: 0.1 10*3/uL (ref 0.0–0.5)
Eosinophils Relative: 1 %
HCT: 47.8 % — ABNORMAL HIGH (ref 36.0–46.0)
Hemoglobin: 15.6 g/dL — ABNORMAL HIGH (ref 12.0–15.0)
Immature Granulocytes: 0 %
Lymphocytes Relative: 45 %
Lymphs Abs: 2.8 10*3/uL (ref 0.7–4.0)
MCH: 28.9 pg (ref 26.0–34.0)
MCHC: 32.6 g/dL (ref 30.0–36.0)
MCV: 88.7 fL (ref 80.0–100.0)
Monocytes Absolute: 0.5 10*3/uL (ref 0.1–1.0)
Monocytes Relative: 7 %
Neutro Abs: 2.8 10*3/uL (ref 1.7–7.7)
Neutrophils Relative %: 47 %
Platelets: 214 10*3/uL (ref 150–400)
RBC: 5.39 MIL/uL — ABNORMAL HIGH (ref 3.87–5.11)
RDW: 14.6 % (ref 11.5–15.5)
WBC: 6.2 10*3/uL (ref 4.0–10.5)
nRBC: 0 % (ref 0.0–0.2)

## 2018-04-14 LAB — TROPONIN I: Troponin I: <0.03 ng/mL (ref <0.03)

## 2018-04-14 LAB — MAGNESIUM: Magnesium: 2 mg/dL (ref 1.7–2.4)

## 2018-04-14 MED ORDER — POTASSIUM CHLORIDE CRYS ER 20 MEQ PO TBCR
40.0000 meq | EXTENDED_RELEASE_TABLET | Freq: Once | ORAL | Status: AC
  Administered 2018-04-14: 40 meq via ORAL
  Filled 2018-04-14: qty 2

## 2018-04-14 NOTE — ED Provider Notes (Signed)
[facility]-EMERGENCY DEPT Provider Note   CSN: [id number] Arrival date & time: 04/14/18  [id number]     History   Chief Complaint Chief Complaint  Patient presents with  . Lips and left side tingling  . Chest Pain    HPI Elizabeth Rogers is a 42 y.o. female.  HPI   42 year old female with left sided chest pain/left-sided tingling and lip tingling.  Onset while at work shortly before arrival.  She states she was sitting down when she began to feel lightheaded and her other symptoms began.  She reports that she has been having intermittent sharp left anterior chest pain for several weeks.  "It feels like somebody sticking me."  Lasts seconds.  Seems like it is exacerbated when she is emotionally upset.  Denies any shortness of breath.  No fevers or chills.  No cough.  She woke up this morning in her usual state of health.  Past Medical History:  Diagnosis Date  . Headache(784.0)   . Hypertension     There are no active problems to display for this patient.   History reviewed. No pertinent surgical history.   OB History   None      Home Medications    Prior to Admission medications   Medication Sig Start Date End Date Taking? Authorizing Provider  hydrochlorothiazide (MICROZIDE) 12.5 MG capsule Take 12.5 mg by mouth 2 (two) times daily.    [provider]  metoprolol succinate (TOPROL-XL) 25 MG 24 hr tablet Take 25 mg by mouth daily.    [provider]    Family History History reviewed. No pertinent family history.  Social History Social History   Tobacco Use  . Smoking status: Current Every Day Smoker    Packs/day: 0.50    Types: Cigarettes  . Smokeless tobacco: Never Used  Substance Use Topics  . Alcohol use: Yes    Comment: occ  . Drug use: No     Allergies   Patient has no known allergies.   Review of Systems Review of Systems All systems reviewed and negative, other than as noted in HPI.   Physical  Exam Updated Vital Signs BP (!) 158/118 (BP Location: Left Arm)   Pulse 69   Temp 98 F (36.7 C) (Oral)   Resp 16   Ht 5\' 11"  (1.803 m)   Wt 108 kg   LMP 04/11/2018 (Approximate)   SpO2 100%   BMI 33.19 kg/m   Physical Exam  Constitutional: She appears well-developed and well-nourished. No distress.  HENT:  Head: Normocephalic and atraumatic.  Eyes: Conjunctivae are normal. Right eye exhibits no discharge. Left eye exhibits no discharge.  Neck: Neck supple.  Cardiovascular: Normal rate, regular rhythm and normal heart sounds. Exam reveals no gallop and no friction rub.  No murmur heard. Pulmonary/Chest: Effort normal and breath sounds normal. No respiratory distress.  Abdominal: Soft. She exhibits no distension. There is no tenderness.  Musculoskeletal: She exhibits no edema or tenderness.  Neurological: She is alert.  Skin: Skin is warm and dry.  Psychiatric: She has a normal mood and affect. Her behavior is normal. Thought content normal.  Flat affect. Crying at times.   Nursing note and vitals reviewed.    ED Treatments / Results  Labs (all labs ordered are listed, but only abnormal results are displayed) Labs Reviewed  CBC WITH DIFFERENTIAL/PLATELET - Abnormal; Notable for the following components:      Result Value   RBC 5.39 (*)  Hemoglobin 15.6 (*)    HCT 47.8 (*)    All other components within normal limits  BASIC METABOLIC PANEL - Abnormal; Notable for the following components:   Potassium 3.3 (*)    Glucose, Bld 109 (*)    Calcium 8.7 (*)    All other components within normal limits  MAGNESIUM  TROPONIN I    EKG EKG Interpretation  Date/Time:  Monday April 14 2018 09:27:47 EST Ventricular Rate:  72 PR Interval:    QRS Duration: 85 QT Interval:  446 QTC Calculation: 489 R Axis:   18 Text Interpretation:  Sinus rhythm left atrial enlargement Probable left ventricular hypertrophy Borderline prolonged QT interval Confirmed by Raeford Razor  [phone]) on 04/14/2018 10:02:20 AM   Radiology No results found.  Procedures Procedures (including critical care time)  Medications Ordered in ED Medications - No data to display   Initial Impression / Assessment and Plan / ED Course  I have reviewed the triage vital signs and the nursing notes.  Pertinent labs & imaging results that were available during my care of the patient were reviewed by me and considered in my medical decision making (see chart for details).    42 year old female with numbness and tingling sensation in her left side.  Neuro exam is nonfocal.  Suspect anxiety component.  Patient is crying at times.  She tells me she feels very stressed needs to find a new job.  I doubt ACS, CVA or or other emergent etiology.  She is mildly hypokalemic but I doubt she is having any significant symptoms from this.  She was given some supplementation in the emergency room though.  Encouragement provided.  Return precautions discussed.  Outpatient follow-up otherwise.  Final Clinical Impressions(s) / ED Diagnoses   Final diagnoses:  Numbness and tingling    ED Discharge Orders    None       Raeford Razor, MD 04/14/18 1145

## 2018-04-14 NOTE — ED Triage Notes (Addendum)
Pt presents with c/o lips and her whole left side tingling. Pt reports she was recently seen because her heart was not pumping correctly. Pt also c/o chest pain and says that she is dizzy and is seeing stars.

## 2018-04-15 ENCOUNTER — Other Ambulatory Visit (HOSPITAL_COMMUNITY): Payer: Self-pay | Admitting: Family Medicine

## 2018-04-15 DIAGNOSIS — I10 Essential (primary) hypertension: Secondary | ICD-10-CM

## 2018-04-23 ENCOUNTER — Ambulatory Visit (HOSPITAL_COMMUNITY)
Admission: RE | Admit: 2018-04-23 | Discharge: 2018-04-23 | Disposition: A | Discharge status: Home / Self Care | Payer: BLUE CROSS/BLUE SHIELD | Source: Ambulatory Visit | Attending: Family Medicine | Admitting: Family Medicine

## 2018-04-23 ENCOUNTER — Encounter (HOSPITAL_COMMUNITY): Payer: Self-pay | Admitting: Family Medicine

## 2018-04-23 DIAGNOSIS — I10 Essential (primary) hypertension: Secondary | ICD-10-CM | POA: Insufficient documentation

## 2018-05-21 ENCOUNTER — Emergency Department (HOSPITAL_COMMUNITY)
Admission: EM | Admit: 2018-05-21 | Discharge: 2018-05-21 | Disposition: A | Discharge status: Home / Self Care | Payer: BLUE CROSS/BLUE SHIELD | Attending: Emergency Medicine | Admitting: Emergency Medicine

## 2018-05-21 ENCOUNTER — Other Ambulatory Visit: Payer: Self-pay

## 2018-05-21 ENCOUNTER — Encounter (HOSPITAL_COMMUNITY): Payer: Self-pay

## 2018-05-21 DIAGNOSIS — Z5321 Procedure and treatment not carried out due to patient leaving prior to being seen by health care provider: Secondary | ICD-10-CM | POA: Insufficient documentation

## 2018-05-21 DIAGNOSIS — R079 Chest pain, unspecified: Secondary | ICD-10-CM | POA: Insufficient documentation

## 2018-05-21 LAB — BASIC METABOLIC PANEL
Anion gap: 9 (ref 5–15)
BUN: 11 mg/dL (ref 6–20)
CO2: 27 mmol/L (ref 22–32)
Calcium: 9.1 mg/dL (ref 8.9–10.3)
Chloride: 104 mmol/L (ref 98–111)
Creatinine, Ser: 0.91 mg/dL (ref 0.44–1.00)
GFR calc non Af Amer: >60 mL/min (ref >60)
Glucose, Bld: 127 mg/dL — ABNORMAL HIGH (ref 70–99)
Potassium: 3.4 mmol/L — ABNORMAL LOW (ref 3.5–5.1)
Sodium: 140 mmol/L (ref 135–145)

## 2018-05-21 LAB — CBC
HCT: 42.1 % (ref 36.0–46.0)
Hemoglobin: 13.5 g/dL (ref 12.0–15.0)
MCH: 28.4 pg (ref 26.0–34.0)
MCHC: 32.1 g/dL (ref 30.0–36.0)
MCV: 88.4 fL (ref 80.0–100.0)
NRBC: 0 % (ref 0.0–0.2)
Platelets: 195 10*3/uL (ref 150–400)
RBC: 4.76 MIL/uL (ref 3.87–5.11)
RDW: 13.1 % (ref 11.5–15.5)
WBC: 9.2 10*3/uL (ref 4.0–10.5)

## 2018-05-21 LAB — I-STAT TROPONIN, ED: TROPONIN I, POC: 0 ng/mL (ref 0.00–0.08)

## 2018-05-21 LAB — I-STAT BETA HCG BLOOD, ED (MC, WL, AP ONLY): I-stat hCG, quantitative: <5 m[IU]/mL (ref <5)

## 2018-05-21 NOTE — ED Triage Notes (Signed)
Pt arrives via POV from work. Pt reports that she was at work at [employer] and began having Chest pain and pressure, Pain on the left side. Pt reports that while at work the the MD she works with obtained an EKG and stated that the EKG was abnormal. Chest xray was normal today. Pt reports that she had a near syncopal episode at that time.

## 2018-08-21 ENCOUNTER — Telehealth: Payer: Self-pay

## 2018-08-21 NOTE — Telephone Encounter (Signed)
-----   Message from [place] sent at 08/20/2018  9:25 AM EDT ----- Regarding: urgent ref   Dr Hanley Hays from [facility] is requesting " in office appointment" for a new patient  with SOB, EF 30-35% to be seen asap.  Debbe Odea from [facility]  [phone] faxed over office notes  Please advise  Laurelyn Sickle

## 2018-08-21 NOTE — Telephone Encounter (Signed)
Spoke with Elizabeth Rogers re: an appt for the pt with Dr. Allyson Sabal per Dr. Dyke Brackett request...  Called Dr. Dyke Brackett office to let them know that Dr. Allyson Sabal can see her on his DOD day Friday 08/29/18 in person... 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 Hanley Hays wants her seen sooner then she can see someone else physically on another DOD schedule or can see if Dr. Allyson Sabal can do a virtual visit sooner.   Pt declined all options and agreed to seeing Dr. Allyson Sabal 08/29/18... she says she will let Dr. Hanley Hays know..  I also advised her that I am not at the [facility] office so I am not sure if her records are there and to bring a copy with her to the appt. Pt agreed.

## 2018-08-27 ENCOUNTER — Telehealth: Payer: Self-pay | Admitting: *Deleted

## 2018-08-27 NOTE — Telephone Encounter (Signed)
Patient call

## 2018-08-29 ENCOUNTER — Ambulatory Visit: Payer: Self-pay | Admitting: Cardiovascular Disease

## 2018-09-24 ENCOUNTER — Telehealth: Payer: Self-pay | Admitting: General Practice

## 2018-09-24 NOTE — Telephone Encounter (Signed)
LMTCB to schedule new patient appt w/ Dr. Antoine Poche

## 2018-11-06 ENCOUNTER — Other Ambulatory Visit: Payer: Self-pay | Admitting: Medical

## 2018-11-06 ENCOUNTER — Other Ambulatory Visit: Payer: Self-pay | Admitting: Physician Assistant

## 2018-11-06 DIAGNOSIS — Z1231 Encounter for screening mammogram for malignant neoplasm of breast: Secondary | ICD-10-CM

## 2018-11-12 NOTE — Telephone Encounter (Signed)
Called and spoke with this patient to reschedule 08/29/18 visit with Dr. Josetta Rogers states she does not want to reschedule-she found another provider.

## 2018-12-23 ENCOUNTER — Inpatient Hospital Stay: Admission: RE | Admit: 2018-12-23 | Payer: BLUE CROSS/BLUE SHIELD | Source: Ambulatory Visit

## 2021-10-03 ENCOUNTER — Encounter: Payer: Self-pay | Admitting: Radiology

## 2021-10-24 ENCOUNTER — Ambulatory Visit (INDEPENDENT_AMBULATORY_CARE_PROVIDER_SITE_OTHER): Payer: No Typology Code available for payment source | Admitting: Radiology

## 2021-10-24 ENCOUNTER — Other Ambulatory Visit (HOSPITAL_COMMUNITY)
Admission: RE | Admit: 2021-10-24 | Discharge: 2021-10-24 | Disposition: A | Discharge status: Home / Self Care | Payer: No Typology Code available for payment source | Source: Ambulatory Visit | Attending: Radiology | Admitting: Radiology

## 2021-10-24 ENCOUNTER — Encounter: Payer: Self-pay | Admitting: Radiology

## 2021-10-24 VITALS — BP 122/84 | Ht 70.0 in | Wt 242.0 lb

## 2021-10-24 DIAGNOSIS — L659 Nonscarring hair loss, unspecified: Secondary | ICD-10-CM | POA: Diagnosis not present

## 2021-10-24 DIAGNOSIS — R5383 Other fatigue: Secondary | ICD-10-CM

## 2021-10-24 DIAGNOSIS — R232 Flushing: Secondary | ICD-10-CM

## 2021-10-24 DIAGNOSIS — Z01419 Encounter for gynecological examination (general) (routine) without abnormal findings: Secondary | ICD-10-CM | POA: Diagnosis present

## 2021-10-24 LAB — CBC: WBC: 6.8 10*3/uL (ref 3.8–10.8)

## 2021-10-24 NOTE — Progress Notes (Signed)
Elizabeth Rogers 05-20-1975 [id number]   History:  46 y.o. G3P2 presents for annual exam. She reports having Mirena inserted in 2017, had periods regularly until this past year she has had amenorrhea. During this time she has also had hot flashes and night sweats and has noticed significant hair loss. Would like STI screen with pap. Overdue for pap and mammogram. Lost her mom, dad and grandmother all in the last 2 years.   Gynecologic History No LMP recorded.   Contraception/Family planning: IUD Sexually active: 6 mos ago Last Pap: 2017. Results were: normal Last mammogram: 2017. Results were: normal  Obstetric History OB History  Gravida Para Term Preterm AB Living  3 2       2   SAB IAB Ectopic Multiple Live Births          2    # Outcome Date GA Lbr Len/2nd Weight Sex Delivery Anes PTL Lv  3 Gravida           2 Para           1 Para              The following portions of the patient's history were reviewed and updated as appropriate: allergies, current medications, past family history, past medical history, past social history, past surgical history, and problem list.  Review of Systems Pertinent items noted in HPI and remainder of comprehensive ROS otherwise negative.   Past medical history, past surgical history, family history and social history were all reviewed and documented in the EPIC chart.   Exam:  Vitals:   10/24/21 1503  BP: 122/84  Weight: 242 lb (109.8 kg)  Height: 5\' 10"  (1.778 m)   Body mass index is 34.72 kg/m.  General appearance:  Normal Thyroid:  Symmetrical, normal in size, without palpable masses or nodularity. Respiratory  Auscultation:  Clear without wheezing or rhonchi Cardiovascular  Auscultation:  Regular rate, without rubs, murmurs or gallops  Edema/varicosities:  Not grossly evident Abdominal  Soft,nontender, without masses, guarding or rebound.  Liver/spleen:  No organomegaly noted  Hernia:  None appreciated  Skin  Inspection:   Grossly normal Breasts: Examined lying and sitting.   Right: Without masses, retractions, nipple discharge or axillary adenopathy.   Left: Without masses, retractions, nipple discharge or axillary adenopathy. Genitourinary   Inguinal/mons:  Normal without inguinal adenopathy  External genitalia:  Normal appearing vulva with no masses, tenderness, or lesions  BUS/Urethra/Skene's glands:  Normal without masses or exudate  Vagina:  Normal appearing with normal color and discharge, no lesions  Cervix:  Normal appearing without discharge or lesions  Uterus:  Normal in size, shape and contour.  Mobile, nontender  Adnexa/parametria:     Rt: Normal in size, without masses or tenderness.   Lt: Normal in size, without masses or tenderness.  Anus and perineum: Normal   Patient informed chaperone available to be present for breast and pelvic exam. Patient has requested no chaperone to be present. Patient has been advised what will be completed during breast and pelvic exam.   Assessment/Plan:   1. Well woman exam with routine gynecological exam  - Cytology - PAP( [facility])  2. Hot flashes  - Thyroid Panel With TSH - FSH  3. Hair loss  - CBC - B12 and Folate Panel  4. Fatigue, unspecified type  - Vitamin D (25 hydroxy)     Discussed SBE, pap, STI screen colonoscopy at 47 as directed/appropriate. Recommend 10/26/21 of exercise weekly, including weight bearing  exercise. Encouraged the use of seatbelts and sunscreen. Return in 1 year for annual or as needed.   Arlie Solomons B WHNP-BC 3:52 PM 10/24/2021

## 2021-10-25 ENCOUNTER — Other Ambulatory Visit: Payer: Self-pay | Admitting: Radiology

## 2021-10-25 DIAGNOSIS — N951 Menopausal and female climacteric states: Secondary | ICD-10-CM

## 2021-10-25 LAB — CBC
HCT: 39.5 % (ref 35.0–45.0)
Hemoglobin: 13.4 g/dL (ref 11.7–15.5)
MCH: 27.1 pg (ref 27.0–33.0)
MCHC: 33.9 g/dL (ref 32.0–36.0)
MCV: 79.8 fL — ABNORMAL LOW (ref 80.0–100.0)
MPV: 10.8 fL (ref 7.5–12.5)
Platelets: 237 10*3/uL (ref 140–400)
RBC: 4.95 10*6/uL (ref 3.80–5.10)
RDW: 13.5 % (ref 11.0–15.0)

## 2021-10-25 LAB — VITAMIN D 25 HYDROXY (VIT D DEFICIENCY, FRACTURES): Vit D, 25-Hydroxy: 28 ng/mL — ABNORMAL LOW (ref 30–100)

## 2021-10-25 LAB — EXTRA SPECIMEN

## 2021-10-25 LAB — THYROID PANEL WITH TSH
Free Thyroxine Index: 3.1 (ref 1.4–3.8)
T3 Uptake: 31 % (ref 22–35)
T4, Total: 10.1 ug/dL (ref 5.1–11.9)
TSH: 2.11 mIU/L

## 2021-10-25 LAB — B12 AND FOLATE PANEL
Folate: 22.5 ng/mL
Vitamin B-12: 1236 pg/mL — ABNORMAL HIGH (ref 200–1100)

## 2021-10-25 LAB — FOLLICLE STIMULATING HORMONE: FSH: 72.2 m[IU]/mL

## 2021-10-25 MED ORDER — ESTRADIOL 0.025 MG/24HR TD PTTW: 1.0000 | MEDICATED_PATCH | TRANSDERMAL | 4 refills | Status: DC

## 2021-10-25 NOTE — Telephone Encounter (Signed)
Pharmacy is set. 

## 2021-10-30 LAB — CYTOLOGY - PAP
Chlamydia: NEGATIVE
Comment: NEGATIVE
Comment: NEGATIVE
Comment: NEGATIVE
Comment: NORMAL
Diagnosis: NEGATIVE
High risk HPV: NEGATIVE
Neisseria Gonorrhea: NEGATIVE
Trichomonas: NEGATIVE

## 2021-10-31 ENCOUNTER — Other Ambulatory Visit: Payer: Self-pay

## 2021-10-31 DIAGNOSIS — B379 Candidiasis, unspecified: Secondary | ICD-10-CM

## 2021-10-31 MED ORDER — FLUCONAZOLE 150 MG PO TABS: 150.0000 mg | ORAL_TABLET | Freq: Once | ORAL | 0 refills | Status: AC

## 2021-11-06 ENCOUNTER — Other Ambulatory Visit: Payer: Self-pay

## 2021-11-06 DIAGNOSIS — N951 Menopausal and female climacteric states: Secondary | ICD-10-CM

## 2021-11-06 MED ORDER — ESTRADIOL 0.025 MG/24HR TD PTTW: 1.0000 | MEDICATED_PATCH | TRANSDERMAL | 4 refills | Status: DC

## 2021-11-21 ENCOUNTER — Other Ambulatory Visit: Payer: Self-pay | Admitting: Radiology

## 2021-11-21 MED ORDER — FLUCONAZOLE 150 MG PO TABS: 150.0000 mg | ORAL_TABLET | Freq: Once | ORAL | 0 refills | Status: AC

## 2022-02-20 ENCOUNTER — Other Ambulatory Visit: Payer: Self-pay | Admitting: Physician Assistant

## 2022-02-20 ENCOUNTER — Other Ambulatory Visit: Payer: Self-pay | Admitting: Radiology

## 2022-02-20 NOTE — Telephone Encounter (Signed)
Breast exam scheduled 02/22/22 at 3:00pm with Wende Crease, NP.

## 2022-02-22 ENCOUNTER — Ambulatory Visit (INDEPENDENT_AMBULATORY_CARE_PROVIDER_SITE_OTHER): Payer: No Typology Code available for payment source | Admitting: Radiology

## 2022-02-22 ENCOUNTER — Other Ambulatory Visit: Payer: Self-pay

## 2022-02-22 ENCOUNTER — Telehealth: Payer: Self-pay

## 2022-02-22 VITALS — BP 120/76

## 2022-02-22 DIAGNOSIS — R300 Dysuria: Secondary | ICD-10-CM | POA: Diagnosis not present

## 2022-02-22 DIAGNOSIS — R140 Abdominal distension (gaseous): Secondary | ICD-10-CM | POA: Diagnosis not present

## 2022-02-22 DIAGNOSIS — N6315 Unspecified lump in the right breast, overlapping quadrants: Secondary | ICD-10-CM

## 2022-02-22 DIAGNOSIS — N6310 Unspecified lump in the right breast, unspecified quadrant: Secondary | ICD-10-CM

## 2022-02-22 NOTE — Progress Notes (Signed)
   Elizabeth Rogers [birth year] [id number]   History:  46 y.o. G3P2 presents with complaints of right breast mass she noticed while showering 4 days ago. Dysuria on and off, abdominal bloating, would like a UPT done today. Has an IUD and on estrogen patch. Last [facility] showed she was postmenopausal.  Gynecologic History Patient's last menstrual period was 04/11/2018 (approximate).   Contraception/Family planning: IUD and post menopausal status Last mammogram: 2017. Results were: normal  Obstetric History OB History  Gravida Para Term Preterm AB Living  3 2       2   SAB IAB Ectopic Multiple Live Births          2    # Outcome Date GA Lbr Len/2nd Weight Sex Delivery Anes PTL Lv  3 Gravida           2 Para           1 Para              The following portions of the patient's history were reviewed and updated as appropriate: allergies, current medications, past family history, past medical history, past social history, past surgical history, and problem list.  Review of Systems Pertinent items noted in HPI and remainder of comprehensive ROS otherwise negative.   Past medical history, past surgical history, family history and social history were all reviewed and documented in the EPIC chart.   Exam:  Vitals:   02/22/22 1447  BP: 120/76   There is no height or weight on file to calculate BMI.  General appearance:  Normal Thyroid:  Symmetrical, normal in size, without palpable masses or nodularity. Respiratory  Auscultation:  Clear without wheezing or rhonchi Cardiovascular  Auscultation:  Regular rate, without rubs, murmurs or gallops  Edema/varicosities:  Not grossly evident Breasts: breasts appear normal, no skin or nipple changes or axillary nodes. +2cm elongated mass present at 11 oclock 3 cm from the nipple of the right breast  Patient informed chaperone available to be present for breast exam. Patient has requested no chaperone to be present.   Assessment/Plan:   1.  Dysuria Neg u/a and UPT - Pregnancy, urine  2. Abdominal bloating - Urinalysis,Complete w/RFL Culture  3. Mass overlapping multiple quadrants of right breast Diagnostic mammogram ordered    Rubbie Battiest B WHNP-BC 3:04 PM 02/22/2022

## 2022-02-22 NOTE — Telephone Encounter (Signed)
Appt scheduled with TBC for Monday, Oct 23 at 10:30am to arrive at 10:15am. Reminded her re: the $75 cancellation fee if not 24 hours notice of reschedule or cancellation.  Spoke with patient and informed her.  Of note, Last Mammo was 2012.

## 2022-02-22 NOTE — Telephone Encounter (Signed)
Mammogram referral Received: Today Chrzanowski, Annitta Needs, NP  [facility] Triage Please refer for diagnostic mammo of right breast. 2cm mass at 11 oclock, non tender, fixed.

## 2022-03-05 ENCOUNTER — Ambulatory Visit
Admission: RE | Admit: 2022-03-05 | Discharge: 2022-03-05 | Disposition: A | Discharge status: Home / Self Care | Payer: No Typology Code available for payment source | Source: Ambulatory Visit | Attending: Radiology | Admitting: Radiology

## 2022-03-05 DIAGNOSIS — N6310 Unspecified lump in the right breast, unspecified quadrant: Secondary | ICD-10-CM

## 2022-03-08 LAB — URINALYSIS, COMPLETE W/RFL CULTURE
Bacteria, UA: NONE SEEN /HPF
Bilirubin Urine: NEGATIVE
Glucose, UA: NEGATIVE
Hyaline Cast: NONE SEEN /LPF
Ketones, ur: NEGATIVE
Leukocyte Esterase: NEGATIVE
Nitrites, Initial: NEGATIVE
Protein, ur: NEGATIVE
RBC / HPF: NONE SEEN /HPF (ref 0–2)
Specific Gravity, Urine: 1.004 (ref 1.001–1.035)
WBC, UA: NONE SEEN /HPF (ref 0–5)
pH: 5.5 (ref 5.0–8.0)

## 2022-03-08 LAB — PREGNANCY, URINE: Preg Test, Ur: NEGATIVE

## 2022-03-08 LAB — NO CULTURE INDICATED

## 2022-12-05 ENCOUNTER — Ambulatory Visit (INDEPENDENT_AMBULATORY_CARE_PROVIDER_SITE_OTHER): Payer: No Typology Code available for payment source | Admitting: Radiology

## 2022-12-05 VITALS — BP 132/84

## 2022-12-05 DIAGNOSIS — L739 Follicular disorder, unspecified: Secondary | ICD-10-CM

## 2022-12-05 DIAGNOSIS — B3731 Acute candidiasis of vulva and vagina: Secondary | ICD-10-CM

## 2022-12-05 DIAGNOSIS — N760 Acute vaginitis: Secondary | ICD-10-CM | POA: Diagnosis not present

## 2022-12-05 LAB — WET PREP FOR TRICH, YEAST, CLUE

## 2022-12-05 MED ORDER — FLUCONAZOLE 150 MG PO TABS: 150.0000 mg | ORAL_TABLET | ORAL | 0 refills | Status: DC

## 2022-12-05 MED ORDER — METRONIDAZOLE 500 MG PO TABS: 500.0000 mg | ORAL_TABLET | Freq: Two times a day (BID) | ORAL | 0 refills | Status: DC

## 2022-12-05 NOTE — Progress Notes (Signed)
      Subjective: Elizabeth Rogers is a 47 y.o. female who complains of vaginal discharge, bump on right labia after shaving, discomfort with intercourse. Changed soaps while on vacation. Interested in help with weight loss.   - Sexually active with 1 female partner(s) - Last sexual encounter: last week    Review of Systems  All other systems reviewed and are negative.   Past Medical History:  Diagnosis Date   Headache(784.0)    Hypertension       Objective:  Today's Vitals   12/05/22 0853  BP: 132/84   There is no height or weight on file to calculate BMI.   -General: no acute distress -Vulva: without discharge 2mm area of folliculitis right lower labia -Vagina: discharge present,  wet prep obtained -Cervix: no lesion or discharge, no CMT -Perineum: no lesions -Uterus: Mobile, non tender -Adnexa: no masses or tenderness   Microscopic wet-mount exam shows clue cells, hyphae.   Raynelle Fanning, CMA present for exam  Assessment:/Plan:   1. BV (bacterial vaginosis) - metroNIDAZOLE (FLAGYL) 500 MG tablet; Take 1 tablet (500 mg total) by mouth 2 (two) times daily.  Dispense: 14 tablet; Refill: 0  2. Yeast vaginitis - fluconazole (DIFLUCAN) 150 MG tablet; Take 1 tablet (150 mg total) by mouth every 3 (three) days.  Dispense: 3 tablet; Refill: 0 - WET PREP FOR TRICH, YEAST, CLUE  3. Folliculitis Warm compresses, monitor for signs of infection   Call insurance to see if they cover GLP-1 meds for weight loss and schedule appt with me if they do  Avoid intercourse until symptoms are resolved. Safe sex encouraged. Avoid the use of soaps or perfumed products in the peri area. Avoid tub baths and sitting in sweaty or wet clothing for prolonged periods of time.

## 2023-01-30 ENCOUNTER — Encounter: Payer: Self-pay | Admitting: Radiology

## 2023-01-30 ENCOUNTER — Ambulatory Visit (INDEPENDENT_AMBULATORY_CARE_PROVIDER_SITE_OTHER): Payer: No Typology Code available for payment source | Admitting: Radiology

## 2023-01-30 VITALS — BP 132/86

## 2023-01-30 DIAGNOSIS — Z113 Encounter for screening for infections with a predominantly sexual mode of transmission: Secondary | ICD-10-CM

## 2023-01-30 DIAGNOSIS — N898 Other specified noninflammatory disorders of vagina: Secondary | ICD-10-CM

## 2023-01-30 DIAGNOSIS — N760 Acute vaginitis: Secondary | ICD-10-CM | POA: Diagnosis not present

## 2023-01-30 LAB — WET PREP FOR TRICH, YEAST, CLUE

## 2023-01-30 MED ORDER — METRONIDAZOLE 500 MG PO TABS: 500.0000 mg | ORAL_TABLET | Freq: Two times a day (BID) | ORAL | 0 refills | Status: DC

## 2023-01-30 NOTE — Progress Notes (Signed)
      Subjective: Elizabeth Rogers is a 47 y.o. female who complains of vaginal discharge, itching, no odor (unsure if related to sex toys or changing soaps). Symptoms began 1 week ago. Last sexual encounter in July- partner has been unfaithful.      Review of Systems  All other systems reviewed and are negative.   Past Medical History:  Diagnosis Date   Headache(784.0)    Hypertension       Objective:  Today's Vitals   01/30/23 0817  BP: 132/86   There is no height or weight on file to calculate BMI.   -General: no acute distress -Vulva: without lesions or discharge -Vagina: discharge present, aptima swab and wet prep obtained -Cervix: no lesion or discharge, no CMT -Perineum: no lesions -Uterus: Mobile, non tender -Adnexa: no masses or tenderness   Microscopic wet-mount exam shows clue cells.   Raynelle Fanning, CMA present for exam  Assessment:/Plan:   1. Screen for STD (sexually transmitted disease) - HIV antibody (with reflex) - RPR - Hepatitis B Surface AntiGEN - Hepatitis C antibody - SURESWAB CT/NG/T. vaginalis  2. Vaginal discharge - WET PREP FOR TRICH, YEAST, CLUE +BV rx sent for Flagyl 500mg  po BID x 7 days  Will contact patient with results of testing completed today. Avoid intercourse until symptoms are resolved. Safe sex encouraged. Avoid the use of soaps or perfumed products in the peri area. Avoid tub baths and sitting in sweaty or wet clothing for prolonged periods of time.

## 2023-01-31 LAB — SURESWAB CT/NG/T. VAGINALIS
C. trachomatis RNA, TMA: NOT DETECTED
N. gonorrhoeae RNA, TMA: NOT DETECTED
Trichomonas vaginalis RNA: NOT DETECTED

## 2023-02-01 LAB — HEPATITIS C ANTIBODY: Hepatitis C Ab: NONREACTIVE

## 2023-02-01 LAB — HIV ANTIBODY (ROUTINE TESTING W REFLEX): HIV 1&2 Ab, 4th Generation: NONREACTIVE

## 2023-02-01 LAB — RPR: RPR Ser Ql: NONREACTIVE

## 2023-02-01 LAB — HEPATITIS B SURFACE ANTIGEN: Hepatitis B Surface Ag: NONREACTIVE

## 2023-03-21 ENCOUNTER — Ambulatory Visit: Payer: No Typology Code available for payment source | Admitting: Radiology

## 2023-04-15 NOTE — Telephone Encounter (Signed)
Spoke with patient. Patient completed abx for tooth extraction, unsure of abx. Reports external vaginal itching, white vaginal discharge, no odor. Symptoms started 4 days ago. Has not tried anything OTC.   Reviewed options of OTC Monistat 1, 3 or 7 day or OV. Patient is going to try OTC Monistat first, will return call if symptoms do not resolve.   Routing to provider for final review. Patient is agreeable to disposition. Will close encounter.

## 2023-07-16 ENCOUNTER — Encounter: Payer: Self-pay | Admitting: Radiology

## 2023-07-16 ENCOUNTER — Ambulatory Visit (INDEPENDENT_AMBULATORY_CARE_PROVIDER_SITE_OTHER): Admitting: Radiology

## 2023-07-16 VITALS — BP 120/82 | Wt 249.0 lb

## 2023-07-16 DIAGNOSIS — Z113 Encounter for screening for infections with a predominantly sexual mode of transmission: Secondary | ICD-10-CM | POA: Diagnosis not present

## 2023-07-16 DIAGNOSIS — B9689 Other specified bacterial agents as the cause of diseases classified elsewhere: Secondary | ICD-10-CM

## 2023-07-16 DIAGNOSIS — N898 Other specified noninflammatory disorders of vagina: Secondary | ICD-10-CM

## 2023-07-16 DIAGNOSIS — N760 Acute vaginitis: Secondary | ICD-10-CM | POA: Diagnosis not present

## 2023-07-16 LAB — WET PREP FOR TRICH, YEAST, CLUE

## 2023-07-16 MED ORDER — METRONIDAZOLE 500 MG PO TABS: 500.0000 mg | ORAL_TABLET | Freq: Two times a day (BID) | ORAL | 0 refills | Status: DC

## 2023-07-16 NOTE — Progress Notes (Signed)
      Subjective: Elizabeth Rogers is a 48 y.o. female who complains of vaginal discharge, odor, no itching. Symptoms began 2 weeks ago. Has not been sexually active since November, would like STI screening while she is here.     Review of Systems  All other systems reviewed and are negative.   Past Medical History:  Diagnosis Date   Headache(784.0)    Hypertension       Objective:  Today's Vitals   07/16/23 1357  BP: 120/82  Weight: 249 lb (112.9 kg)   Body mass index is 35.73 kg/m.   Physical Exam Vitals and nursing note reviewed. Exam conducted with a chaperone present.  Constitutional:      Appearance: Normal appearance. She is well-developed.  Pulmonary:     Effort: Pulmonary effort is normal.  Abdominal:     General: Abdomen is flat.     Palpations: Abdomen is soft.  Genitourinary:    General: Normal vulva.     Vagina: Vaginal discharge present. No erythema, bleeding or lesions.     Cervix: Normal. No discharge, friability, lesion or erythema.     Uterus: Normal.      Adnexa: Right adnexa normal and left adnexa normal.  Neurological:     Mental Status: She is alert.  Psychiatric:        Mood and Affect: Mood normal.        Thought Content: Thought content normal.        Judgment: Judgment normal.    Microscopic wet-mount exam shows clue cells.   Raynelle Fanning, CMA present for exam  Assessment:/Plan:   1. Acute vaginitis (Primary) - WET PREP FOR TRICH, YEAST, CLUE +BV rx sent for flagyl  2. Screening examination for venereal disease - SURESWAB CT/NG/T. vaginalis    Will contact patient with results of testing completed today. Avoid intercourse until symptoms are resolved. Safe sex encouraged. Avoid the use of soaps or perfumed products in the peri area. Avoid tub baths and sitting in sweaty or wet clothing for prolonged periods of time.    Jocabed Cheese B, NP 2:07 PM

## 2023-07-17 LAB — SURESWAB CT/NG/T. VAGINALIS
C. trachomatis RNA, TMA: NOT DETECTED
N. gonorrhoeae RNA, TMA: NOT DETECTED
Trichomonas vaginalis RNA: NOT DETECTED

## 2024-03-10 ENCOUNTER — Encounter: Payer: Self-pay | Admitting: Radiology

## 2024-03-10 ENCOUNTER — Ambulatory Visit (INDEPENDENT_AMBULATORY_CARE_PROVIDER_SITE_OTHER): Admitting: Radiology

## 2024-03-10 VITALS — BP 126/88 | Wt 253.0 lb

## 2024-03-10 DIAGNOSIS — N760 Acute vaginitis: Secondary | ICD-10-CM | POA: Diagnosis not present

## 2024-03-10 LAB — WET PREP FOR TRICH, YEAST, CLUE

## 2024-03-10 MED ORDER — METRONIDAZOLE 0.75 % VA GEL: 1.0000 | Freq: Every day | VAGINAL | 0 refills | Status: AC

## 2024-03-10 MED ORDER — FLUCONAZOLE 150 MG PO TABS: 150.0000 mg | ORAL_TABLET | ORAL | 0 refills | Status: AC

## 2024-03-10 NOTE — Progress Notes (Signed)
      Subjective: Elizabeth Rogers is a 48 y.o. female who complains of vaginal discharge no itching no odor x 1 week. Stopped using dial soap. No new partners.  Review of Systems  All other systems reviewed and are negative.   Past Medical History:  Diagnosis Date   Headache(784.0)    Hypertension       Objective:  Today's Vitals   03/10/24 0942  BP: 126/88  Weight: 253 lb (114.8 kg)   Body mass index is 36.3 kg/m.   Physical Exam Vitals and nursing note reviewed. Exam conducted with a chaperone present.  Constitutional:      Appearance: Normal appearance. She is well-developed.  Pulmonary:     Effort: Pulmonary effort is normal.  Abdominal:     General: Abdomen is flat.     Palpations: Abdomen is soft.  Genitourinary:    General: Normal vulva.     Vagina: Vaginal discharge present. No erythema, bleeding or lesions.     Cervix: Normal. No discharge, friability, lesion or erythema.     Uterus: Normal.      Adnexa: Right adnexa normal and left adnexa normal.  Neurological:     Mental Status: She is alert.  Psychiatric:        Mood and Affect: Mood normal.        Thought Content: Thought content normal.        Judgment: Judgment normal.     Microscopic wet-mount exam shows clue cells, hyphae.   Darice Hoit, CMA present for exam  Assessment:/Plan:  1. Acute vaginitis (Primary) - WET PREP FOR TRICH, YEAST, CLUE - metroNIDAZOLE  (METROGEL ) 0.75 % vaginal gel; Place 1 Applicatorful vaginally at bedtime for 5 days.  Dispense: 70 g; Refill: 0 - fluconazole  (DIFLUCAN ) 150 MG tablet; Take 1 tablet (150 mg total) by mouth every 3 (three) days.  Dispense: 3 tablet; Refill: 0   Avoid intercourse until symptoms are resolved. Safe sex encouraged. Avoid the use of soaps or perfumed products in the peri area. Avoid tub baths and sitting in sweaty or wet clothing for prolonged periods of time.     Matilynn Dacey B, NP 9:52 AM

## 2024-03-10 NOTE — Patient Instructions (Signed)
